# Patient Record
Sex: Female | Born: 1945 | Race: Black or African American | Hispanic: No | Smoking: Current every day smoker
Health system: Southern US, Community
[De-identification: ages and names within clinical notes are randomized; demographics above are authoritative.]

## PROBLEM LIST (undated history)

## (undated) DIAGNOSIS — F17200 Nicotine dependence, unspecified, uncomplicated: Secondary | ICD-10-CM

## (undated) DIAGNOSIS — M1990 Unspecified osteoarthritis, unspecified site: Secondary | ICD-10-CM

## (undated) DIAGNOSIS — E8881 Metabolic syndrome: Secondary | ICD-10-CM

## (undated) DIAGNOSIS — Z9989 Dependence on other enabling machines and devices: Secondary | ICD-10-CM

## (undated) DIAGNOSIS — E119 Type 2 diabetes mellitus without complications: Secondary | ICD-10-CM

## (undated) DIAGNOSIS — G4733 Obstructive sleep apnea (adult) (pediatric): Secondary | ICD-10-CM

## (undated) DIAGNOSIS — J449 Chronic obstructive pulmonary disease, unspecified: Secondary | ICD-10-CM

## (undated) DIAGNOSIS — C801 Malignant (primary) neoplasm, unspecified: Secondary | ICD-10-CM

## (undated) DIAGNOSIS — L723 Sebaceous cyst: Secondary | ICD-10-CM

## (undated) HISTORY — DX: Sebaceous cyst: L72.3

## (undated) HISTORY — DX: Unspecified osteoarthritis, unspecified site: M19.90

## (undated) HISTORY — DX: Chronic obstructive pulmonary disease, unspecified: J44.9

## (undated) HISTORY — PX: OTHER SURGICAL HISTORY: SHX169

## (undated) HISTORY — DX: Type 2 diabetes mellitus without complications: E11.9

## (undated) HISTORY — PX: THROAT SURGERY: SHX803

## (undated) HISTORY — DX: Malignant (primary) neoplasm, unspecified: C80.1

## (undated) HISTORY — DX: Dependence on other enabling machines and devices: Z99.89

## (undated) HISTORY — DX: Nicotine dependence, unspecified, uncomplicated: F17.200

## (undated) HISTORY — DX: Metabolic syndrome: E88.810

## (undated) HISTORY — DX: Obstructive sleep apnea (adult) (pediatric): G47.33

## (undated) HISTORY — DX: Metabolic syndrome: E88.81

---

## 1968-06-23 HISTORY — PX: CHOLECYSTECTOMY: SHX55

## 2004-10-11 ENCOUNTER — Encounter: Payer: Self-pay | Admitting: Family Medicine

## 2005-06-23 HISTORY — PX: OTHER SURGICAL HISTORY: SHX169

## 2006-08-25 ENCOUNTER — Encounter: Payer: Self-pay | Admitting: Family Medicine

## 2006-08-25 LAB — CONVERTED CEMR LAB
Alkaline Phosphatase: 79 units/L
Glucose, Bld: 177 mg/dL
HCT: 41 %
HDL: 48 mg/dL
Hemoglobin: 13.7 g/dL
Sodium: 141 meq/L
Total Bilirubin: 0.2 mg/dL
Total Protein: 7.1 g/dL
Triglycerides: 244 mg/dL
WBC, blood: 5.7 10*3/uL

## 2006-10-07 ENCOUNTER — Ambulatory Visit: Payer: Self-pay | Admitting: Family Medicine

## 2006-10-07 DIAGNOSIS — M25519 Pain in unspecified shoulder: Secondary | ICD-10-CM

## 2006-10-07 DIAGNOSIS — M1990 Unspecified osteoarthritis, unspecified site: Secondary | ICD-10-CM | POA: Insufficient documentation

## 2006-10-08 ENCOUNTER — Encounter: Payer: Self-pay | Admitting: Family Medicine

## 2006-10-09 ENCOUNTER — Telehealth: Payer: Self-pay | Admitting: Family Medicine

## 2006-10-09 ENCOUNTER — Encounter: Payer: Self-pay | Admitting: Family Medicine

## 2006-10-09 DIAGNOSIS — J449 Chronic obstructive pulmonary disease, unspecified: Secondary | ICD-10-CM

## 2006-10-09 DIAGNOSIS — F17200 Nicotine dependence, unspecified, uncomplicated: Secondary | ICD-10-CM | POA: Insufficient documentation

## 2006-10-09 DIAGNOSIS — J4489 Other specified chronic obstructive pulmonary disease: Secondary | ICD-10-CM | POA: Insufficient documentation

## 2006-10-09 DIAGNOSIS — M5030 Other cervical disc degeneration, unspecified cervical region: Secondary | ICD-10-CM

## 2006-10-23 ENCOUNTER — Encounter: Payer: Self-pay | Admitting: Family Medicine

## 2006-10-26 ENCOUNTER — Encounter: Payer: Self-pay | Admitting: Family Medicine

## 2006-11-24 ENCOUNTER — Encounter: Payer: Self-pay | Admitting: Family Medicine

## 2007-06-24 DIAGNOSIS — C801 Malignant (primary) neoplasm, unspecified: Secondary | ICD-10-CM

## 2007-06-24 HISTORY — PX: OTHER SURGICAL HISTORY: SHX169

## 2007-06-24 HISTORY — DX: Malignant (primary) neoplasm, unspecified: C80.1

## 2008-03-07 ENCOUNTER — Ambulatory Visit: Payer: Self-pay | Admitting: Family Medicine

## 2008-03-07 DIAGNOSIS — L723 Sebaceous cyst: Secondary | ICD-10-CM | POA: Insufficient documentation

## 2008-03-22 ENCOUNTER — Ambulatory Visit: Payer: Self-pay | Admitting: Family Medicine

## 2008-04-06 ENCOUNTER — Ambulatory Visit: Payer: Self-pay | Admitting: Family Medicine

## 2008-04-06 ENCOUNTER — Encounter: Payer: Self-pay | Admitting: Family Medicine

## 2008-04-06 ENCOUNTER — Other Ambulatory Visit: Admission: RE | Admit: 2008-04-06 | Discharge: 2008-04-06 | Payer: Self-pay | Admitting: Family Medicine

## 2008-04-06 DIAGNOSIS — I10 Essential (primary) hypertension: Secondary | ICD-10-CM | POA: Insufficient documentation

## 2008-04-06 DIAGNOSIS — M899 Disorder of bone, unspecified: Secondary | ICD-10-CM | POA: Insufficient documentation

## 2008-04-06 DIAGNOSIS — M949 Disorder of cartilage, unspecified: Secondary | ICD-10-CM

## 2008-04-06 LAB — HM PAP SMEAR

## 2008-04-19 ENCOUNTER — Encounter: Payer: Self-pay | Admitting: Family Medicine

## 2008-04-20 ENCOUNTER — Encounter: Payer: Self-pay | Admitting: Family Medicine

## 2008-04-24 ENCOUNTER — Encounter: Payer: Self-pay | Admitting: Family Medicine

## 2008-04-25 ENCOUNTER — Encounter: Payer: Self-pay | Admitting: Family Medicine

## 2008-04-25 LAB — CONVERTED CEMR LAB
ALT: 15 units/L (ref 0–35)
AST: 12 units/L (ref 0–37)
Albumin: 4.3 g/dL (ref 3.5–5.2)
Alkaline Phosphatase: 62 units/L (ref 39–117)
BUN: 21 mg/dL (ref 6–23)
CO2: 29 meq/L (ref 19–32)
Calcium: 9.3 mg/dL (ref 8.4–10.5)
Chloride: 104 meq/L (ref 96–112)
Cholesterol: 188 mg/dL (ref 0–200)
Creatinine, Ser: 0.76 mg/dL (ref 0.40–1.20)
Glucose, Bld: 117 mg/dL — ABNORMAL HIGH (ref 70–99)
HCT: 43 % (ref 36.0–46.0)
HDL: 42 mg/dL (ref >39)
Hemoglobin: 14.2 g/dL (ref 12.0–15.0)
LDL Cholesterol: 112 mg/dL — ABNORMAL HIGH (ref 0–99)
MCHC: 33 g/dL (ref 30.0–36.0)
MCV: 93.3 fL (ref 78.0–100.0)
Platelets: 298 10*3/uL (ref 150–400)
Potassium: 4.5 meq/L (ref 3.5–5.3)
RBC: 4.61 M/uL (ref 3.87–5.11)
RDW: 13.8 % (ref 11.5–15.5)
Sodium: 143 meq/L (ref 135–145)
TSH: 0.959 microintl units/mL (ref 0.350–4.50)
Total Bilirubin: 0.3 mg/dL (ref 0.3–1.2)
Total CHOL/HDL Ratio: 4.5
Total Protein: 6.9 g/dL (ref 6.0–8.3)
Triglycerides: 168 mg/dL — ABNORMAL HIGH (ref <150)
VLDL: 34 mg/dL (ref 0–40)
Vit D, 1,25-Dihydroxy: 23 — ABNORMAL LOW (ref 30–89)
WBC: 6.6 10*3/uL (ref 4.0–10.5)

## 2008-05-04 ENCOUNTER — Encounter: Payer: Self-pay | Admitting: Family Medicine

## 2008-05-05 ENCOUNTER — Ambulatory Visit: Payer: Self-pay | Admitting: Family Medicine

## 2008-05-05 DIAGNOSIS — R7301 Impaired fasting glucose: Secondary | ICD-10-CM

## 2008-05-05 LAB — CONVERTED CEMR LAB: Blood Glucose, Fasting: 117 mg/dL

## 2008-05-12 ENCOUNTER — Encounter: Payer: Self-pay | Admitting: Family Medicine

## 2008-05-17 ENCOUNTER — Encounter: Payer: Self-pay | Admitting: Family Medicine

## 2008-05-22 ENCOUNTER — Encounter: Payer: Self-pay | Admitting: Family Medicine

## 2008-05-22 ENCOUNTER — Telehealth: Payer: Self-pay | Admitting: Family Medicine

## 2008-05-22 DIAGNOSIS — C50919 Malignant neoplasm of unspecified site of unspecified female breast: Secondary | ICD-10-CM | POA: Insufficient documentation

## 2008-05-22 LAB — HM COLONOSCOPY

## 2008-06-01 ENCOUNTER — Encounter: Payer: Self-pay | Admitting: Family Medicine

## 2008-06-01 ENCOUNTER — Encounter: Admission: RE | Admit: 2008-06-01 | Discharge: 2008-06-01 | Payer: Self-pay | Admitting: General Surgery

## 2008-06-05 ENCOUNTER — Encounter: Admission: RE | Admit: 2008-06-05 | Discharge: 2008-06-05 | Payer: Self-pay | Admitting: General Surgery

## 2008-06-06 ENCOUNTER — Ambulatory Visit (HOSPITAL_BASED_OUTPATIENT_CLINIC_OR_DEPARTMENT_OTHER): Admission: RE | Admit: 2008-06-06 | Discharge: 2008-06-07 | Payer: Self-pay | Admitting: General Surgery

## 2008-06-06 ENCOUNTER — Encounter: Payer: Self-pay | Admitting: Family Medicine

## 2008-06-06 ENCOUNTER — Encounter (INDEPENDENT_AMBULATORY_CARE_PROVIDER_SITE_OTHER): Payer: Self-pay | Admitting: General Surgery

## 2008-06-20 ENCOUNTER — Ambulatory Visit (HOSPITAL_COMMUNITY): Admission: RE | Admit: 2008-06-20 | Discharge: 2008-06-20 | Payer: Self-pay | Admitting: General Surgery

## 2008-06-20 ENCOUNTER — Encounter (INDEPENDENT_AMBULATORY_CARE_PROVIDER_SITE_OTHER): Payer: Self-pay | Admitting: General Surgery

## 2008-07-17 ENCOUNTER — Encounter: Payer: Self-pay | Admitting: Family Medicine

## 2008-08-03 ENCOUNTER — Encounter: Payer: Self-pay | Admitting: Family Medicine

## 2008-08-06 ENCOUNTER — Encounter: Payer: Self-pay | Admitting: Family Medicine

## 2008-08-24 ENCOUNTER — Encounter: Payer: Self-pay | Admitting: Family Medicine

## 2008-10-12 ENCOUNTER — Encounter: Payer: Self-pay | Admitting: Family Medicine

## 2008-10-26 ENCOUNTER — Encounter: Payer: Self-pay | Admitting: Family Medicine

## 2009-01-12 ENCOUNTER — Encounter: Payer: Self-pay | Admitting: Family Medicine

## 2009-01-19 ENCOUNTER — Encounter: Payer: Self-pay | Admitting: Family Medicine

## 2009-01-23 ENCOUNTER — Encounter: Payer: Self-pay | Admitting: Family Medicine

## 2009-04-27 ENCOUNTER — Ambulatory Visit: Payer: Self-pay | Admitting: Family Medicine

## 2009-06-19 ENCOUNTER — Encounter: Payer: Self-pay | Admitting: Family Medicine

## 2009-06-20 DIAGNOSIS — E785 Hyperlipidemia, unspecified: Secondary | ICD-10-CM | POA: Insufficient documentation

## 2009-06-20 LAB — CONVERTED CEMR LAB
ALT: 17 units/L (ref 0–35)
Albumin: 4.3 g/dL (ref 3.5–5.2)
BUN: 18 mg/dL (ref 6–23)
CO2: 28 meq/L (ref 19–32)
Calcium: 9 mg/dL (ref 8.4–10.5)
Chloride: 102 meq/L (ref 96–112)
Cholesterol: 232 mg/dL — ABNORMAL HIGH (ref 0–200)
Creatinine, Ser: 0.7 mg/dL (ref 0.40–1.20)
HCT: 41.2 % (ref 36.0–46.0)
Hemoglobin: 14.4 g/dL (ref 12.0–15.0)
Platelets: 305 10*3/uL (ref 150–400)
Total CHOL/HDL Ratio: 5.8
WBC: 5.2 10*3/uL (ref 4.0–10.5)

## 2009-08-27 ENCOUNTER — Encounter: Payer: Self-pay | Admitting: Family Medicine

## 2009-11-07 ENCOUNTER — Encounter: Payer: Self-pay | Admitting: Family Medicine

## 2010-01-25 ENCOUNTER — Encounter: Admission: RE | Admit: 2010-01-25 | Discharge: 2010-01-25 | Payer: Self-pay | Admitting: General Surgery

## 2010-01-25 LAB — HM MAMMOGRAPHY

## 2010-02-11 ENCOUNTER — Encounter: Payer: Self-pay | Admitting: Family Medicine

## 2010-02-19 ENCOUNTER — Encounter: Payer: Self-pay | Admitting: Family Medicine

## 2010-03-20 IMAGING — CR DG CHEST 2V
2 series · 2 of 2 positions shown · non-contrast
Comparison: No prior studies.

CLINICAL DATA: Right breast cancer.  Preoperative chest.

CHEST - 2 VIEW

[w chest pa]
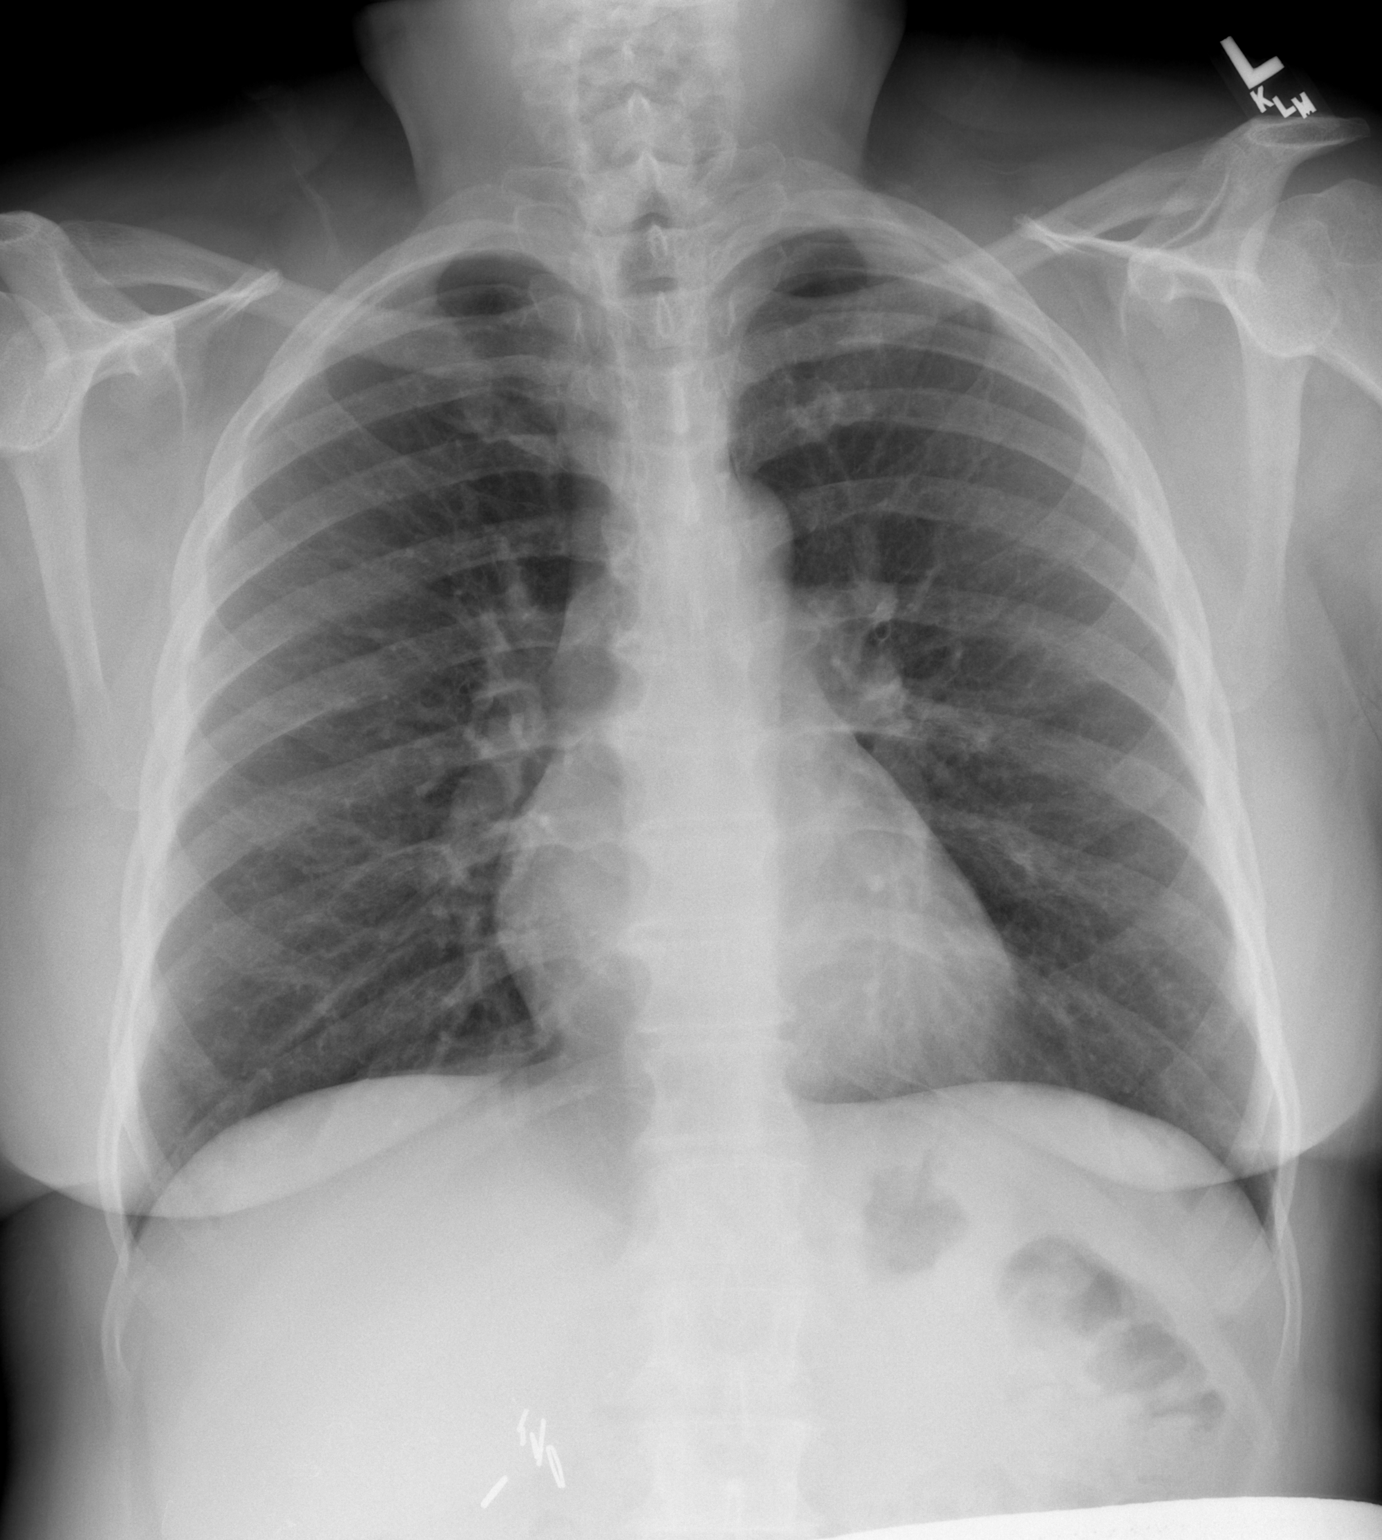

[w chest lat]
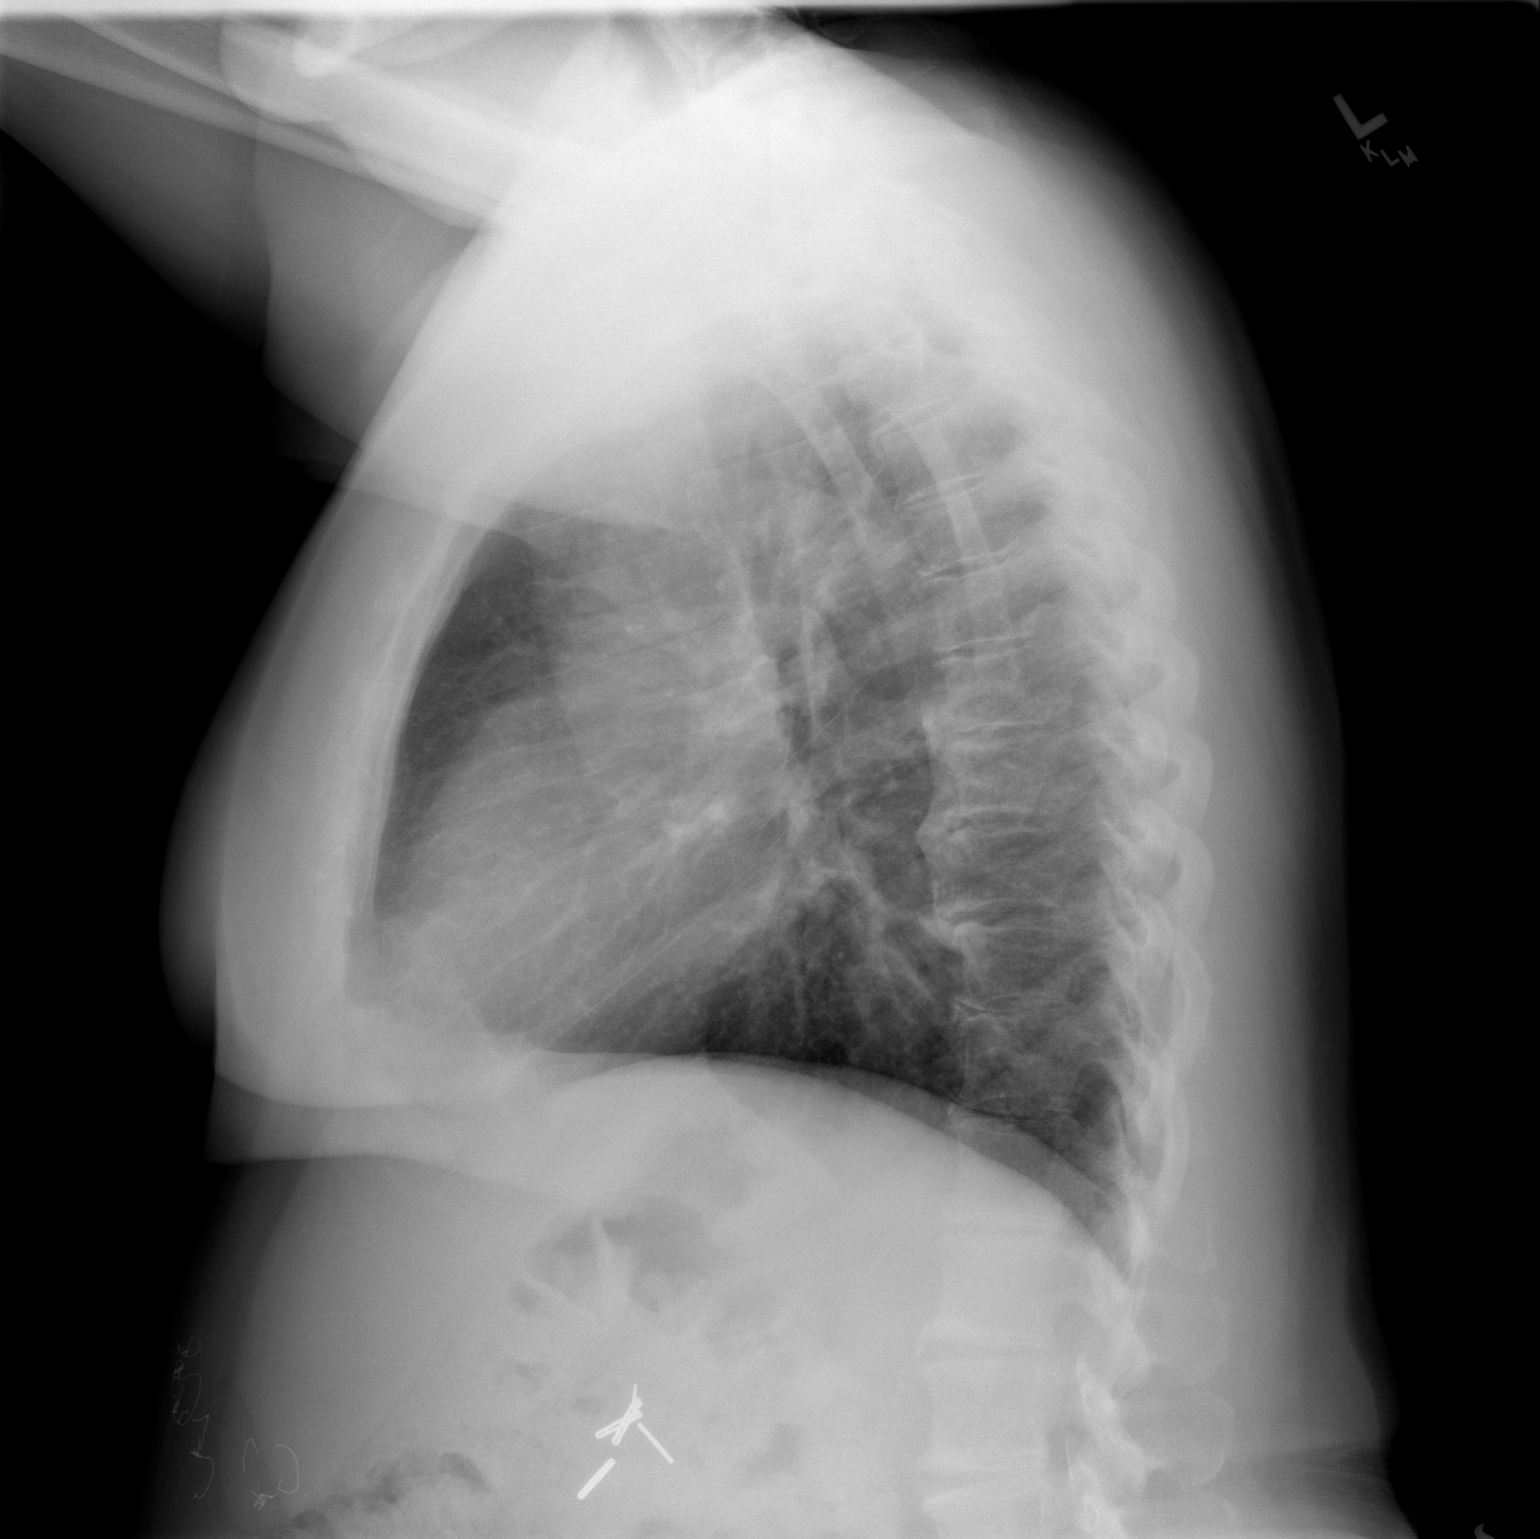

[2 of 2 positions shown; findings below may reference images not displayed]

FINDINGS: The lungs are clear.  The heart and mediastinal
structures are normal.
IMPRESSION: No evidence for active chest disease.

## 2010-04-15 ENCOUNTER — Encounter: Payer: Self-pay | Admitting: Family Medicine

## 2010-05-20 ENCOUNTER — Telehealth (INDEPENDENT_AMBULATORY_CARE_PROVIDER_SITE_OTHER): Payer: Self-pay | Admitting: *Deleted

## 2010-05-20 ENCOUNTER — Ambulatory Visit: Payer: Self-pay | Admitting: Family Medicine

## 2010-05-20 ENCOUNTER — Encounter: Admission: RE | Admit: 2010-05-20 | Discharge: 2010-05-20 | Payer: Self-pay | Admitting: Family Medicine

## 2010-05-20 DIAGNOSIS — R1011 Right upper quadrant pain: Secondary | ICD-10-CM

## 2010-05-21 LAB — CONVERTED CEMR LAB
Alkaline Phosphatase: 78 units/L (ref 39–117)
BUN: 19 mg/dL (ref 6–23)
CO2: 32 meq/L (ref 19–32)
Creatinine, Ser: 0.74 mg/dL (ref 0.40–1.20)
Eosinophils Absolute: 0.1 10*3/uL (ref 0.0–0.7)
Eosinophils Relative: 1 % (ref 0–5)
Glucose, Bld: 119 mg/dL — ABNORMAL HIGH (ref 70–99)
HCT: 44 % (ref 36.0–46.0)
Hemoglobin: 14.6 g/dL (ref 12.0–15.0)
Lymphs Abs: 2.3 10*3/uL (ref 0.7–4.0)
MCV: 94.4 fL (ref 78.0–100.0)
Monocytes Absolute: 0.4 10*3/uL (ref 0.1–1.0)
Monocytes Relative: 7 % (ref 3–12)
Platelets: 292 10*3/uL (ref 150–400)
Sodium: 140 meq/L (ref 135–145)
Total Bilirubin: 0.3 mg/dL (ref 0.3–1.2)
WBC: 4.9 10*3/uL (ref 4.0–10.5)

## 2010-05-23 HISTORY — PX: OTHER SURGICAL HISTORY: SHX169

## 2010-06-04 ENCOUNTER — Encounter: Payer: Self-pay | Admitting: Family Medicine

## 2010-07-23 NOTE — Letter (Signed)
Summary: [facility]   Imported By: Lanelle Bal 11/27/2009 11:51:02  _____________________________________________________________________  External Attachment:    Type:   Image     Comment:   External Document

## 2010-07-23 NOTE — Letter (Signed)
Summary: [facility]  [facility]   Imported By: Lanelle Bal 09/03/2009 13:15:32  _____________________________________________________________________  External Attachment:    Type:   Image     Comment:   External Document

## 2010-07-23 NOTE — Progress Notes (Signed)
----   Converted from flag ---- ---- 05/20/2010 9:11 AM, Seymour Bars DO wrote: pt needs f/u with GI - Dr Nadara Mode- had abnormal colonoscopy in 09 and was due for repeat in 2010 but had breast cancer.  now, she is having LUQ pain x 3 mos ------------------------------  05/20/2010- Scheduled pt to see Dr. Jacqulyn Bath at [facility] on 06/04/2010 @ 4:00pm. Dr. Nadara Mode has left the practice and moved to [facility]. This appt is at the [place] location. KJ LPN

## 2010-07-23 NOTE — Assessment & Plan Note (Signed)
Summary: pre - op PHYSICAL   Vital Signs:  Patient profile:   65 year old female Height:      63.75 inches Weight:      179 pounds BMI:     31.08 O2 Sat:      96 % on Room air Pulse rate:   73 / minute BP sitting:   113 / 69  (left arm) Cuff size:   regular  Vitals Entered By: Payton Spark CMA (May 20, 2010 8:49 AM)  O2 Flow:  Room air CC: CPE w/ out pap   History of Present Illness: Lori Hess presents for CPE w/o pap and a pre- op physical for upcoming cataract surgery.  She has a hx of R breast cancer from 09 s/p lumpectomy, radiation and now on Arimidex.  She sees Dr Carolynne Edouard and is UTD with mammogram and breast MRI but failed to f/u with Dr Darrick Huntsman in October and has not had a PET scan done.  She had multiple polyps on colonoscopy in 09 and was due for f/u in 2010 but did not go.  Her fsting labs were done 05-2009.  She has had RUQ pain x 3 mos but no blood in stools, constipation, anorexia, diarrhea or nausea.    Denies CP or DOE but she is a smoker with COPD, not on meds.  Has not had a CXR done in yrs.  Her flu shot is due.  Tdap is UTD.  Pneumovax done in 09.    Current Medications (verified): 1)  Hydrochlorothiazide 25 Mg Tabs (Hydrochlorothiazide) .Marland Kitchen.. 1 Tab By Mouth Qam 2)  Vitamin D 50,000 Iu Capsules .Marland KitchenMarland Kitchen. 1 Cap By Mouth Q Week 3)  Omeprazole 40 Mg Cpdr (Omeprazole) .... Take 1 Tab By Mouth Once Daily 4)  Anastrozole 1 Mg Tabs (Anastrozole) .... Take 1 Tab By Mouth Once Daily  Allergies (verified): No Known Drug Allergies  Past History:  Past Medical History: Osteoarthritis OSA- on CPAP COPD G2P2 tobacco dependence, tried Chantix EKG done 3-08, 04-2010 metabolic syndrome sebaceous cyst parietal area Vit D <7.0 = VERY LOW! 3-08 R sided breast cancer (DR TOTH/ MCCUNNIFF/ SHEARER) 2009  Past Surgical History: Reviewed history from 04/27/2009 and no changes required. eyelid lifting 2007 gall bladder 1970 R breast lumpectomy 09 Dr Carolynne Edouard  Family  History: Reviewed history from 10/07/2006 and no changes required. father DM, deceased mother died from colon cancer in her 5s, also had endometrial cancer; HTN brother healthy  Social History: Reviewed history from 10/07/2006 and no changes required. 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  No relationship.  2 grown children are local.  Lives alone.  Smokes 1 ppd x 35 yrs.  Denies ETOH.  Does not exercise, but active in the yard.  Fair diet.    Review of Systems       The patient complains of abdominal pain.  The patient denies anorexia, fever, weight loss, weight gain, vision loss, decreased hearing, hoarseness, chest pain, syncope, dyspnea on exertion, peripheral edema, prolonged cough, headaches, hemoptysis, melena, hematochezia, severe indigestion/heartburn, hematuria, incontinence, genital sores, muscle weakness, suspicious skin lesions, transient blindness, difficulty walking, depression, unusual weight change, abnormal bleeding, enlarged lymph nodes, angioedema, breast masses, and testicular masses.    Physical Exam  General:  alert, well-developed, well-nourished, well-hydrated, and overweight-appearing.   Head:  normocephalic and atraumatic.   Eyes:  pupils equal, pupils round, and pupils reactive to light.  bilat ant chamber clouding Ears:  EACs patent; TMs translucent and  gray with good cone of light and bony landmarks.  Nose:  no nasal discharge.   Mouth:  pharynx pink and moist.   Neck:  no masses.   Lungs:  Normal respiratory effort, chest expands symmetrically. Lungs are clear to auscultation, no crackles or wheezes. Heart:  irreg rhythm, no M Abdomen:  RUQ cholecystectomy scar, tender w/o hernia.  ND.  NABS.  No HSM. Pulses:  2+ radial and pedal pulses Extremities:  no UE or LE edema Neurologic:  gait normal.   Skin:  color normal.   Cervical Nodes:  No lymphadenopathy noted Psych:  good eye contact, not anxious appearing, and not depressed  appearing.     Impression & Recommendations:  Problem # 1:  HEALTH MAINTENANCE EXAM (ICD-V70.0) Keeping healthy checklist for women reviewed. BP at goal.  BMI 31 c/w class I obesity. Overdue for repeat colonoscopy with Dr Nadara Mode-- scheduled f/u with him. R breast CA - mammo is UTD and has seen dr Carolynne Edouard, on Armidiex.  Will set her up for f/u with Dr Darrick Huntsman. EKG done showing NSR with sinus arrythmia, no ischemia.  Plan to get stress test in 2012. Enocuarged smoking cessation and compliance iwth CPAP.  CXR today. Flu shot today.  CBC and CMP for RUQ pain; fasting lipids at f/u visit. Work on [employer], regular exercise, MVI and calcium with D daily.  Complete Medication List: 1)  Hydrochlorothiazide 25 Mg Tabs (Hydrochlorothiazide) .Marland Kitchen.. 1 tab by mouth qam 2)  Vitamin D 50,000 Iu Capsules  .Marland Kitchen.. 1 cap by mouth q week 3)  Omeprazole 40 Mg Cpdr (Omeprazole) .... Take 1 tab by mouth once daily 4)  Anastrozole 1 Mg Tabs (Anastrozole) .... Take 1 tab by mouth once daily  Other Orders: T-DG Chest 2 View [phone]) T-CBC w/Diff [phone]) T-Comprehensive Metabolic Panel [phone]) Admin 1st Vaccine (42706) Flu Vaccine 52yrs + (23762) EKG w/ Interpretation (93000)  Patient Instructions: 1)  Labs/ CXR to be done downstairs today. 2)  Will call you w/ results tomorrow.  3)  Form completed for cataract surgery. 4)  Flu shot today. 5)  Pap smear due in 1 yr. 6)  Will get you back in with GI doctor. 7)  Plan to update stress test in 2012. 8)  Schedule follow up COPD/ HTN/ fasting labs in 3 mos.   Orders Added: 1)  T-DG Chest 2 View [71020] 2)  T-CBC w/Diff [[id number]] 3)  T-Comprehensive Metabolic Panel [[id number]] 4)  Admin 1st Vaccine [90471] 5)  Flu Vaccine 67yrs + [90658] 6)  Est. Patient age 80&> [73710] 7)  EKG w/ Interpretation [93000]  Flu Vaccine Consent Questions     Do you have a history of severe allergic reactions to this vaccine? no    Any prior history of  allergic reactions to egg and/or gelatin? no    Do you have a sensitivity to the preservative Thimersol? no    Do you have a past history of Guillan-Barre Syndrome? no    Do you currently have an acute febrile illness? no    Have you ever had a severe reaction to latex? no    Vaccine information given and explained to patient? yes    Are you currently pregnant? no    Lot Number:[id number]   Exp Date:12/21/2010   Site Given  Left Deltoid IManagement-CCC]     .lbflu

## 2010-07-23 NOTE — Letter (Signed)
Summary: [facility]   Imported By: Lanelle Bal 05/04/2010 11:43:04  _____________________________________________________________________  External Attachment:    Type:   Image     Comment:   External Document

## 2010-07-23 NOTE — Letter (Signed)
Summary: [facility]   Imported By: Lanelle Bal 03/01/2010 12:16:08  _____________________________________________________________________  External Attachment:    Type:   Image     Comment:   External Document

## 2010-07-23 NOTE — Letter (Signed)
Summary: [facility]  [facility]   Imported By: Lanelle Bal 03/04/2010 09:30:08  _____________________________________________________________________  External Attachment:    Type:   Image     Comment:   External Document

## 2010-07-25 NOTE — Consult Note (Signed)
Summary: [facility]   Imported By: Lanelle Bal 06/13/2010 14:06:24  _____________________________________________________________________  External Attachment:    Type:   Image     Comment:   External Document

## 2010-08-20 ENCOUNTER — Ambulatory Visit: Payer: Self-pay | Admitting: Family Medicine

## 2010-08-28 ENCOUNTER — Other Ambulatory Visit (HOSPITAL_COMMUNITY)
Admission: RE | Admit: 2010-08-28 | Discharge: 2010-08-28 | Disposition: A | Discharge status: Home / Self Care | Payer: BC Managed Care – PPO | Source: Ambulatory Visit | Attending: Family Medicine | Admitting: Family Medicine

## 2010-08-28 ENCOUNTER — Encounter (INDEPENDENT_AMBULATORY_CARE_PROVIDER_SITE_OTHER): Payer: BC Managed Care – PPO | Admitting: Family Medicine

## 2010-08-28 ENCOUNTER — Other Ambulatory Visit: Payer: Self-pay | Admitting: Family Medicine

## 2010-08-28 ENCOUNTER — Encounter: Payer: Self-pay | Admitting: Family Medicine

## 2010-08-28 DIAGNOSIS — Z01419 Encounter for gynecological examination (general) (routine) without abnormal findings: Secondary | ICD-10-CM

## 2010-08-29 ENCOUNTER — Encounter: Payer: Self-pay | Admitting: Family Medicine

## 2010-08-30 ENCOUNTER — Telehealth: Payer: Self-pay | Admitting: Family Medicine

## 2010-08-30 ENCOUNTER — Encounter: Payer: Self-pay | Admitting: Family Medicine

## 2010-08-30 LAB — CONVERTED CEMR LAB
AST: 12 units/L (ref 0–37)
Albumin: 4.2 g/dL (ref 3.5–5.2)
Alkaline Phosphatase: 72 units/L (ref 39–117)
HDL: 44 mg/dL (ref >39)
LDL Cholesterol: 142 mg/dL — ABNORMAL HIGH (ref 0–99)
Potassium: 4.2 meq/L (ref 3.5–5.3)
Sodium: 138 meq/L (ref 135–145)
Total Bilirubin: 0.3 mg/dL (ref 0.3–1.2)
Total Protein: 6.5 g/dL (ref 6.0–8.3)
Triglycerides: 212 mg/dL — ABNORMAL HIGH (ref <150)
VLDL: 42 mg/dL — ABNORMAL HIGH (ref 0–40)
Vit D, 25-Hydroxy: 21 ng/mL — ABNORMAL LOW (ref 30–89)

## 2010-09-03 ENCOUNTER — Encounter: Payer: Self-pay | Admitting: Family Medicine

## 2010-09-03 NOTE — Progress Notes (Signed)
Summary: Vesicare  Phone Note Call from Patient Call back at Work Phone [phone]   Caller: Patient Call For: Seymour Bars DO Reason for Call: Refill Medication Summary of Call: Pt recevied samples of Vesicare at visit on 08/28/10 and states that it WORKS!  Pt would like rx sent to [facility].  Rx to be sent per Dr. Cathey Endow. Initial call taken by: Francee Piccolo CMA Duncan Dull),  August 30, 2010 1:40 PM    New/Updated Medications: VESICARE 5 MG TABS (SOLIFENACIN SUCCINATE) Take 1 tablet by mouth once a day Prescriptions: VESICARE 5 MG TABS (SOLIFENACIN SUCCINATE) Take 1 tablet by mouth once a day  #30 x 3   Entered and Authorized by:   Seymour Bars DO   Signed by:   Seymour Bars DO on 08/30/2010   Method used:   Electronically to        [facility]  [phone]* (retail)       [address]       [place], [place]  [address]       Ph: [phone]       Fax: [phone]   RxID:   [id number]

## 2010-09-03 NOTE — Assessment & Plan Note (Signed)
Summary: CPE with pap   Vital Signs:  Patient profile:   65 year old female Height:      63.75 inches Weight:      188 pounds BMI:     32.64 O2 Sat:      96 % on Room air Pulse rate:   76 / minute BP sitting:   117 / 68  (left arm) Cuff size:   large  Vitals Entered By: Payton Spark CMA (August 28, 2010 9:25 AM)  O2 Flow:  Room air CC: CPE    Primary Care Provider:  Seymour Bars DO  CC:  CPE .  History of Present Illness: 65 yo AAF presents for CPE.  She is doing well on HCTZ for HTN.  She has gained wt but admits to eating more.  she is UTD with her mammogram but is due for her DEXA (it has been 3 yrs).  She is on anti estrogen meds for her hx of breast cancer and follows only with Dr Carolynne Edouard once a year.  She had her pap done last 3 yrs ago.  her PNX and Tetanus vaccines are UTD.  She continues to smoke and is not interested in quitting.  She had a normal CXR and EKG prior to her cataract (L) surgery done in November and denies CP or DOE.  She is doing well on her current meds.  She had a colonoscopy done in 09 and thinks she is due for f/u.  Current Medications (verified): 1)  Hydrochlorothiazide 25 Mg Tabs (Hydrochlorothiazide) .Marland Kitchen.. 1 Tab By Mouth Qam 2)  Omeprazole 40 Mg Cpdr (Omeprazole) .... Take 1 Tab By Mouth Once Daily 3)  Anastrozole 1 Mg Tabs (Anastrozole) .... Take 1 Tab By Mouth Once Daily  Allergies (verified): No Known Drug Allergies  Past History:  Past Medical History: Reviewed history from 05/20/2010 and no changes required. Osteoarthritis OSA- on CPAP COPD G2P2 tobacco dependence, tried Chantix EKG done 3-08, 04-2010 metabolic syndrome sebaceous cyst parietal area Vit D <7.0 = VERY LOW! 3-08 R sided breast cancer (DR TOTH/ MCCUNNIFF/ SHEARER) 2009  Past Surgical History: eyelid lifting 2007 gall bladder 1970 R breast lumpectomy 09 Dr Carolynne Edouard L cataract surgery 05-2010  Family History: Reviewed history from 10/07/2006 and no changes  required. father DM, deceased mother died from colon cancer in her 52s, also had endometrial cancer; HTN brother healthy  Social History: Reviewed history from 10/07/2006 and no changes required. 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  No relationship.  2 grown children are local.  Lives alone.  Smokes 1 ppd x 35 yrs.  Denies ETOH.  Does not exercise, but active in the yard.  Fair diet.    Review of Systems       The patient complains of weight gain.  The patient denies anorexia, fever, weight loss, vision loss, decreased hearing, hoarseness, chest pain, syncope, dyspnea on exertion, peripheral edema, prolonged cough, headaches, hemoptysis, abdominal pain, melena, hematochezia, severe indigestion/heartburn, hematuria, incontinence, genital sores, muscle weakness, suspicious skin lesions, transient blindness, difficulty walking, depression, unusual weight change, abnormal bleeding, enlarged lymph nodes, angioedema, breast masses, and testicular masses.    Physical Exam  General:  alert, well-developed, well-nourished, well-hydrated, and overweight-appearing.   Head:  normocephalic and atraumatic.   Eyes:  anteriro chamber clouding on the R/ lens implant on the L, PERRLA Ears:  EACs patent; TMs translucent and gray with good cone of light and bony landmarks.  Nose:  no nasal discharge.  boggy turbinates Mouth:  pharynx pink and moist.   Neck:  no masses.  no audible carotid bruits Breasts:  radiation changes (hardness) to the R breast, no masses, no nipple discharge, no tenderness, and no adenopathy.   Lungs:  Normal respiratory effort, chest expands symmetrically. Lungs are clear to auscultation, no crackles or wheezes.  prolonged exp phase Heart:  Normal rate and regular rhythm. S1 and S2 normal without gallop, murmur, click, rub or other extra sounds. Abdomen:  soft, non-tender, normal bowel sounds, no distention, no masses, no guarding, no hepatomegaly, and no  splenomegaly.  no AA bruits Genitalia:  Pelvic Exam:        External: normal female genitalia without lesions or masses, atrophic        Vagina: normal without lesions or masses        Cervix: normal without lesions or masses, due to atrophy and pt discomfort, os not able to be visualized.        Adnexa: normal bimanual exam without masses or fullness        Uterus: normal by palpation        Pap smear: performed Pulses:  2+ radial and pedal pulses Extremities:  no LE edema Skin:  color normal.   Cervical Nodes:  No lymphadenopathy noted Psych:  good eye contact, not anxious appearing, and not depressed appearing.     Impression & Recommendations:  Problem # 1:  Gynecological examination-routine (ICD-V72.31) Keeping healthy checklist for women reviewed. BP at goal.  BMI 32 c/w class I obesity. Counsled on healthy diet, regular exercise. Taking MVI daily and calcium/ D daily. Update fasting labs. Had normal EKG and CXR 04-2010 and is due for repeat colonoscopy with Dr Jonathon Bellows. Mammogram UTD but due for DEXA, ordered. Plan to do PFTs this year +/- stress test and will need PVD/ low back pain w/u.  Complete Medication List: 1)  Hydrochlorothiazide 25 Mg Tabs (Hydrochlorothiazide) .Marland Kitchen.. 1 tab by mouth qam 2)  Omeprazole 40 Mg Cpdr (Omeprazole) .... Take 1 tab by mouth once daily 3)  Anastrozole 1 Mg Tabs (Anastrozole) .... Take 1 tab by mouth once daily  Other Orders: T-DXA Bone Density/ Appendicular (60454) T-Dual DXA Bone Density/ Axial (09811) T-CBC w/Diff ([id number]) T-Comprehensive Metabolic Panel [phone] T-Lipid Profile ([id number]) T-Vitamin D (25-Hydroxy) (24401-02725)  Patient Instructions: 1)  Will call you with pap smear results next wk and lab results today. 2)  Update DEXA downstairs at your convenience. 3)  I will contact Dr Nadara Mode re: your next colonoscopy. 4)  Return for f/u leg pain and overactive bladder in 6 wks. 5)  Try Tylenol arthritis as needed  for arthritis pain.   Orders Added: 1)  T-DXA Bone Density/ Appendicular [77081] 2)  T-Dual DXA Bone Density/ Axial [77080] 3)  T-CBC w/Diff [[id number]] 4)  T-Comprehensive Metabolic Panel [[id number]] 5)  T-Lipid Profile [[id number]] 6)  T-Vitamin D (25-Hydroxy) [25956-38756] 7)  Est. Patient age 34&> [43329]

## 2010-09-04 ENCOUNTER — Encounter: Payer: Self-pay | Admitting: Family Medicine

## 2010-09-11 ENCOUNTER — Ambulatory Visit (INDEPENDENT_AMBULATORY_CARE_PROVIDER_SITE_OTHER): Payer: BC Managed Care – PPO | Admitting: Family Medicine

## 2010-09-11 ENCOUNTER — Encounter: Payer: Self-pay | Admitting: Family Medicine

## 2010-09-11 VITALS — BP 119/63 | HR 94 | Ht 63.0 in | Wt 188.0 lb

## 2010-09-11 DIAGNOSIS — E119 Type 2 diabetes mellitus without complications: Secondary | ICD-10-CM

## 2010-09-11 DIAGNOSIS — E785 Hyperlipidemia, unspecified: Secondary | ICD-10-CM

## 2010-09-11 DIAGNOSIS — R7301 Impaired fasting glucose: Secondary | ICD-10-CM

## 2010-09-11 DIAGNOSIS — I10 Essential (primary) hypertension: Secondary | ICD-10-CM

## 2010-09-11 LAB — POCT GLYCOSYLATED HEMOGLOBIN (HGB A1C): Hemoglobin A1C: 8.3

## 2010-09-11 MED ORDER — METFORMIN HCL 500 MG PO TABS: 500.0000 mg | ORAL_TABLET | Freq: Two times a day (BID) | ORAL | Status: DC

## 2010-09-11 NOTE — Patient Instructions (Signed)
A1C 8.3 consistent with a diagnosis of type II diabetes. Will get you set up to see a nutritionist.  Start METFORMIN 500 mg with dinner only (until your next office visit with me) to help bring sugar down.  Stay on Simvastatin at bedtime for high cholesterol and RX Vitamin D once a week.  Work on [employer] and regular exercise.  Return to recheck fasting sugar here in 6 wks.

## 2010-09-11 NOTE — Assessment & Plan Note (Signed)
BP at goal on HCTZ alone.  Plan to add ACE or ARB if urine microalbumin is +.

## 2010-09-11 NOTE — Assessment & Plan Note (Signed)
A1c was highi at 8.3 and her fasting sugar was 136, both c/w T2DM code.  Will start her on a diabetic diet and refer her to a nutritionist.  Will start her on metformin 500 mg only at night with dinner until her f/u in 6 wks.  I did not start her on home sugar checks yet.  Given info on diabetic diet today.  She needs to make some drastic changes to diet and exercise.  Plan to get a urine micro and dilated eye exam at her next visit + a monofilament.

## 2010-09-11 NOTE — Assessment & Plan Note (Signed)
Counseled on the importance of taking her simvastatin daily since she just started this.  Check LDL with LFTs in 2 mos.

## 2010-09-11 NOTE — Progress Notes (Signed)
  Subjective:    Patient ID: Lori Hess, female    DOB: 1945-09-11, 65 y.o.   MRN: [id number]  HPI 65 yo AAF presents for f/u elevated sugar of 136 on her CMP 2 wks ago.  She was never previously had a diagnosis of diabetes but both her mom and dad were diabetic.  She gets no exercise and admits to eating a lot of sweets at night.  She did fill her RX for Simvastatin for high cholesterol and is doing well on it for the past 4 days but she is leary to stay on cholesterol meds.  She denies CP or DOE.  She continues to smoke.  She also started RX Vitamin D once a week.    Review of Systems  Constitutional: Negative for fatigue and unexpected weight change.  Eyes: Negative for visual disturbance.  Respiratory: Negative for shortness of breath.   Cardiovascular: Negative for chest pain and palpitations.  Genitourinary: Negative for decreased urine volume.  Neurological: Negative for light-headedness and numbness.  Psychiatric/Behavioral: Negative for dysphoric mood.       Objective:   Physical Exam  Constitutional: She appears well-developed and well-nourished. No distress.       obese          Assessment & Plan:

## 2010-09-22 ENCOUNTER — Other Ambulatory Visit: Payer: Self-pay | Admitting: Family Medicine

## 2010-10-11 ENCOUNTER — Ambulatory Visit: Payer: BC Managed Care – PPO | Admitting: Family Medicine

## 2010-10-26 ENCOUNTER — Other Ambulatory Visit: Payer: Self-pay | Admitting: Family Medicine

## 2010-11-05 NOTE — Op Note (Signed)
Lori Hess, GROBE NO.:  [id number]   MEDICAL RECORD NO.:  [id number]          PATIENT TYPE:  AMB   LOCATION:  DSC                          FACILITY:  [facility]   PHYSICIAN:  Ollen Gross. Vernell Morgans, M.D. DATE OF BIRTH:  1946/03/11   DATE OF PROCEDURE:  06/06/2008  DATE OF DISCHARGE:                               OPERATIVE REPORT   PREOPERATIVE DIAGNOSIS:  Right breast cancer.   POSTOPERATIVE DIAGNOSIS:  Right breast cancer.   PROCEDURE:  Right breast lumpectomy and sentinel node biopsy with  injection of blue dye and subsequent right axillary lymph node  dissection.   SURGEON:  Ollen Gross. Vernell Morgans, MD   ANESTHESIA:  General endotracheal.   PROCEDURE:  After informed consent was obtained, the patient was brought  to the operating room and placed in supine position on the operating  room table.  After adequate induction of general anesthesia with an LMA,  the patient's right breast, chest and axilla were prepped with Betadine  and draped in usual sterile manner.  Earlier in the day, the patient had  undergone injection of 1 mCi of technetium sulfur colloid in the  subareolar position.  At this point, we also injected 2 mL of methylene  blue and 3 mL of injectable saline also into the subareolar position.  The breast was massaged for several minutes.  Using a NeoProbe, we were  able to identify a hot spot in the right axilla.  A small transverse  incision was made overlying this hot spot with a 15 blade knife.  This  incision was carried down through the skin and subcutaneous tissue  sharply with electrocautery until the axilla was entered.  A Weitlaner  retractor was deployed using the NeoProbe to guide the dissection and  using a blunt hemostat dissection, we were able to identify a hot blue  lymph node.  This was excised by combination of sharp Bovie dissection  and then the lymphatics were clamped with hemostats, divided and ligated  with 3-0 Vicryl ties.  This was  sent as sentinel node #1.  Second area  of radioactivity was also identified.  It was not blue and it was  excised sharply with the electrocautery and sent as sentinel node #2.  Once this was accomplished, the axilla appeared to be hemostatic.  The  deep layer was closed with interrupted 3-0 Vicryl stitches and the skin  was closed with a running 4-0 Monocryl subcuticular stitch.  At this  point, the attention was turned to the right breast in the upper inner  quadrant.  There was a palpable mass.  A curvilinear transverse incision  was made overlying the mass with a 15 blade knife.  This incision was  carried down through the skin and subcutaneous tissue sharply with the  electrocautery.  Once into the breast tissue, the mass could be  palpated.  A circular portion of breast tissue was excised sharply  around the mass.  This was all done with electrocautery and the  dissection was carried all the way down to the chest wall.  Once the  mass  was removed, it was oriented with the pain kit according to the  assigned colors.  The specimen radiograph was then obtained that showed  the clip to be in the center of the specimen and this was then sent to  pathology for further evaluation.  Hemostasis was achieved using the  Bovie electrocautery.  The wound was infiltrated with 0.25% Marcaine and  then irrigated with copious amounts of saline.  The deep layer of the  wound was then closed with interrupted 3-0 Vicryl stitches and the skin  was closed with a running 4-0 Monocryl subcuticular stitch.  At this  point, the pathologist called [place] back and said that sentinel node #1 was  positive for metastatic cancer of the breast.  At this point, we then  reopened the axillary incision and extended it anteriorly and  posteriorly with a 15 blade knife and the electrocautery.  At this  point, the dissection was carried into the subcutaneous tissue towards  the latissimus muscle and chest wall.  Once the  latissimus muscle and  chest wall were identified, then we were able to dissect superiorly  until we were able to identify the axillary vein.  Once these landmarks  were identified, the lymph node contents within the boundaries of these  landmarks was then dissected free by blunt right angle dissection.  The  long thoracic and thoracodorsal nerves were identified and care was  taken to preserve these nerves.  Once the contents of the axilla were  free, these were sent to pathology as right axillary contents.  The  wound was then examined and found to be hemostatic.  The wound was  irrigated with copious amounts of saline.  A small stab incision was  made inferior to the incision with a 15 blade knife.  A tonsil clamp was  then placed through that incision into the operative bed and the axilla.  A 19-French round Harrison Mons drain was then brought into the operative bed  and out through the skin opening.  The drain was laid along the chest  wall in the axilla.  The drain was anchored to the skin with a 3-0 nylon  stitch.  The deep layer of the wound was then closed with interrupted 3-  0 Vicryl stitches and the skin was closed with a running 4-0 Monocryl  subcuticular stitch.  Dermabond dressings were applied to each of the  incisions and sterile dressings to the drain.  The patient tolerated the  procedure well.  The drain was placed to bulb suction.  At the end of  the case, all needle, sponge and instrument counts were correct.  The  patient was then awakened and taken to recovery in stable condition.  During the case, the patient did not tolerate the LMA, probably  secondary to her recent throat surgery, so the anesthesiologist  converted to general endotracheal tube and she tolerated this well.      Ollen Gross. Vernell Morgans, M.D.  Electronically Signed     PST/MEDQ  D:  06/06/2008  T:  06/06/2008  Job:  [id number]

## 2010-11-05 NOTE — Op Note (Signed)
NAMECHARLA, Lori Hess              ACCOUNT NO.:  [id number]   MEDICAL RECORD NO.:  [id number]          PATIENT TYPE:  AMB   LOCATION:  SDS                          FACILITY:  [facility]   PHYSICIAN:  Ollen Gross. Vernell Morgans, M.D. DATE OF BIRTH:  22-Jun-1946   DATE OF PROCEDURE:  DATE OF DISCHARGE:  06/20/2008                               OPERATIVE REPORT   PREOPERATIVE DIAGNOSIS:  Right breast cancer with a positive lateral  margin.   POSTOPERATIVE DIAGNOSIS:  Right breast cancer with a positive lateral  margin.   PROCEDURES:  Re-excision of lateral margin in the right breast.   SURGEON:  Ollen Gross. Vernell Morgans, MD   ANESTHESIA:  General endotracheal.   PROCEDURE:  After informed consent was obtained, the patient was brought  to the operating room, placed in supine position on the operating room  table.  After adequate induction of general anesthesia, the patient's  right breast was prepped with Betadine and draped in usual sterile  manner.  Her old incision was opened sharply with a 15 blade knife.  The  seroma cavity was evacuated and the cavity itself looked very clean.  The lateral margin was then re-excised sharply with the electrocautery.  The tissue appeared to be normal, fatty appearing tissue.  This was  marked with a stitch on the true surgical margin and sent to pathology  for further evaluation.  Hemostasis was achieved using the Bovie  electrocautery.  The wound was irrigated copious amounts of saline.  The  deep layer of the wound was then closed with interrupted 3-0 Vicryl  stitches and the skin was closed with the running 4-0 Monocryl  subcuticular stitch.  Dermabond dressing was applied.  Prior to starting  the case, the patient's right axillary drain was putting out minimal  fluid and was removed without difficulty.  The patient tolerated the  procedure well.  At the end of case, all needle, sponge, and instrument  counts correct.  The patient was then awaken, taken to recovery  in  stable condition.      Ollen Gross. Vernell Morgans, M.D.  Electronically Signed     PST/MEDQ  D:  06/20/2008  T:  06/20/2008  Job:  [id number]

## 2010-11-08 ENCOUNTER — Encounter: Payer: Self-pay | Admitting: Family Medicine

## 2010-11-08 ENCOUNTER — Ambulatory Visit (INDEPENDENT_AMBULATORY_CARE_PROVIDER_SITE_OTHER): Payer: BC Managed Care – PPO | Admitting: Family Medicine

## 2010-11-08 ENCOUNTER — Telehealth: Payer: Self-pay | Admitting: Family Medicine

## 2010-11-08 VITALS — BP 108/57 | HR 83 | Wt 187.0 lb

## 2010-11-08 DIAGNOSIS — I10 Essential (primary) hypertension: Secondary | ICD-10-CM

## 2010-11-08 DIAGNOSIS — E119 Type 2 diabetes mellitus without complications: Secondary | ICD-10-CM

## 2010-11-08 LAB — POCT UA - MICROALBUMIN: Creatinine, POC: 200 mg/dL

## 2010-11-08 MED ORDER — HYDROCHLOROTHIAZIDE 25 MG PO TABS: 12.5000 mg | ORAL_TABLET | Freq: Every day | ORAL | Status: DC

## 2010-11-08 NOTE — Patient Instructions (Signed)
Keep up the good work.    Cut HCTZ to 1/2 tab once daily.  Urine microalbumin today.  Return for f/u sugars/ A1C / BMP in 2 mos.

## 2010-11-08 NOTE — Telephone Encounter (Signed)
Pls let pt know that her urine microalbumin came back normal.

## 2010-11-08 NOTE — Assessment & Plan Note (Signed)
Doing well on metformin 500 mg bid.  Urine microalbumin normal today.  Held off on adding ACEi.  Repeat BMP at next visit in 2 mos.  Eye exam UTD.  Will do monofilament next visit.  Meter given today.  Start checking AM fastings.

## 2010-11-08 NOTE — Assessment & Plan Note (Signed)
BP has come down with healthy dietary changes.  Will cut her HCTZ in 1/2 to 12.5 mg once daily.  F/u in 2 mos.  Labs UTD.

## 2010-11-08 NOTE — Progress Notes (Signed)
  Subjective:    Patient ID: Peterson Ao, female    DOB: Feb 27, 1946, 65 y.o.   MRN: [id number]  HPI 65 yo AAF presents for f/u T2DM from new dx 2 mos ago.  She has changed her diet.  She just picked up a meter today.  She plans to to start walking soon.  She feels well on metformin 500 mg bid with breakfast and dinner.  Tolerating it well.  Saw the nutritionist.  Her eye exam is UTD.  She is due for urine microalbumin today.  BP 108/57  Pulse 83  Wt 187 lb (84.823 kg)  SpO2 96%    Review of Systems  Constitutional: Negative for fatigue and unexpected weight change.  Eyes: Negative for visual disturbance.  Gastrointestinal: Negative for nausea and diarrhea.  Genitourinary: Negative for frequency.  Neurological: Negative for numbness.       Objective:   Physical Exam  Constitutional: She appears well-developed and well-nourished. No distress.       obese  Eyes: Pupils are equal, round, and reactive to light.  Neck: Neck supple.  Cardiovascular: Normal rate, regular rhythm and normal heart sounds.   Pulmonary/Chest: Effort normal and breath sounds normal.  Musculoskeletal: She exhibits no edema.  Skin: Skin is warm and dry.  Psychiatric: She has a normal mood and affect.          Assessment & Plan:

## 2010-11-11 NOTE — Telephone Encounter (Signed)
Pt notified microalbumin normal. Jarvis Newcomer, LPN [facility]

## 2010-12-03 ENCOUNTER — Encounter: Payer: Self-pay | Admitting: Family Medicine

## 2011-01-10 ENCOUNTER — Ambulatory Visit: Payer: BC Managed Care – PPO | Admitting: Family Medicine

## 2011-03-16 ENCOUNTER — Other Ambulatory Visit: Payer: Self-pay | Admitting: Family Medicine

## 2011-03-28 LAB — COMPREHENSIVE METABOLIC PANEL
ALT: 30 U/L (ref 0–35)
AST: 21 U/L (ref 0–37)
Albumin: 4.1 g/dL (ref 3.5–5.2)
Calcium: 9.5 mg/dL (ref 8.4–10.5)
GFR calc Af Amer: >60 mL/min (ref >60)
Sodium: 137 mEq/L (ref 135–145)
Total Protein: 7.3 g/dL (ref 6.0–8.3)

## 2011-03-28 LAB — CBC
MCHC: 33.1 g/dL (ref 30.0–36.0)
MCHC: 33.8 g/dL (ref 30.0–36.0)
MCV: 93.2 fL (ref 78.0–100.0)
RBC: 4.37 MIL/uL (ref 3.87–5.11)
RDW: 13.8 % (ref 11.5–15.5)

## 2011-03-28 LAB — DIFFERENTIAL
Eosinophils Absolute: 0 10*3/uL (ref 0.0–0.7)
Eosinophils Relative: 1 % (ref 0–5)
Lymphs Abs: 2.8 10*3/uL (ref 0.7–4.0)
Monocytes Relative: 6 % (ref 3–12)

## 2011-05-17 ENCOUNTER — Other Ambulatory Visit: Payer: Self-pay | Admitting: Family Medicine

## 2011-05-19 ENCOUNTER — Encounter (INDEPENDENT_AMBULATORY_CARE_PROVIDER_SITE_OTHER): Payer: Self-pay | Admitting: General Surgery

## 2011-05-19 ENCOUNTER — Ambulatory Visit (INDEPENDENT_AMBULATORY_CARE_PROVIDER_SITE_OTHER): Payer: BC Managed Care – PPO | Admitting: General Surgery

## 2011-05-19 VITALS — BP 140/80 | HR 80 | Temp 98.6°F | Resp 12 | Ht 63.0 in | Wt 184.0 lb

## 2011-05-19 DIAGNOSIS — C50919 Malignant neoplasm of unspecified site of unspecified female breast: Secondary | ICD-10-CM

## 2011-05-19 NOTE — Progress Notes (Signed)
Subjective:     Patient ID: Lori Hess, female   DOB: 1946/03/22, 65 y.o.   MRN: [id number]  HPI The patient is a 65 year old black female who is now 3 years out from a right lumpectomy and axillary node dissection for a T1 C. N1 right breast cancer. She was ER PR positive and HER-2/neu negative. She finished radiation and is now taking anastrozole. She is tolerating this well. She has no complaints. She did have a short episode of nipple sensitivity a couple weeks ago but this has resolved  Review of Systems  Constitutional: Negative.   HENT: Negative.   Eyes: Negative.   Respiratory: Negative.   Cardiovascular: Negative.   Gastrointestinal: Negative.   Genitourinary: Negative.   Musculoskeletal: Negative.   Skin: Negative.   Neurological: Negative.   Hematological: Negative.   Psychiatric/Behavioral: Negative.        Objective:   Physical Exam  Constitutional: She is oriented to person, place, and time. She appears well-developed and well-nourished.  HENT:  Head: Normocephalic and atraumatic.  Eyes: Conjunctivae and EOM are normal. Pupils are equal, round, and reactive to light.  Neck: Normal range of motion. Neck supple.  Cardiovascular: Normal rate, regular rhythm and normal heart sounds.   Pulmonary/Chest: Effort normal and breath sounds normal.       The firmness of the right breast from the radiation has slightly improved. No palpable mass in either breast. No axillary supraclavicular or cervical lymphadenopathy.  Abdominal: Soft. Bowel sounds are normal. She exhibits no mass. There is no tenderness.  Musculoskeletal: Normal range of motion.  Neurological: She is alert and oriented to person, place, and time.  Skin: Skin is warm and dry.  Psychiatric: She has a normal mood and affect. Her behavior is normal.       Assessment:     3 years out from a right lumpectomy and axillary node dissection    Plan:     At this point she will continue her anastrazole. She  will also continue regular self exams. We will see her back in about 6 months

## 2011-05-19 NOTE — Patient Instructions (Signed)
Continue regular self exams Continue anastrazole 

## 2011-06-13 ENCOUNTER — Telehealth: Payer: Self-pay | Admitting: Family Medicine

## 2011-06-13 NOTE — Telephone Encounter (Signed)
Please call patient. Normal mammogram.  Repeat in 1 year.  

## 2011-06-13 NOTE — Telephone Encounter (Signed)
LMOM informing Pt  

## 2011-07-01 ENCOUNTER — Encounter (INDEPENDENT_AMBULATORY_CARE_PROVIDER_SITE_OTHER): Payer: Self-pay | Admitting: Hematology and Oncology

## 2011-08-27 ENCOUNTER — Encounter: Payer: Self-pay | Admitting: *Deleted

## 2011-09-01 ENCOUNTER — Ambulatory Visit (INDEPENDENT_AMBULATORY_CARE_PROVIDER_SITE_OTHER): Payer: BC Managed Care – PPO | Admitting: Physician Assistant

## 2011-09-01 ENCOUNTER — Encounter: Payer: Self-pay | Admitting: Physician Assistant

## 2011-09-01 VITALS — BP 143/73 | HR 86 | Temp 99.5°F | Ht 62.0 in | Wt 183.0 lb

## 2011-09-01 DIAGNOSIS — J40 Bronchitis, not specified as acute or chronic: Secondary | ICD-10-CM

## 2011-09-01 DIAGNOSIS — J329 Chronic sinusitis, unspecified: Secondary | ICD-10-CM

## 2011-09-01 DIAGNOSIS — I10 Essential (primary) hypertension: Secondary | ICD-10-CM

## 2011-09-01 MED ORDER — METHYLPREDNISOLONE (PAK) 4 MG PO TABS: ORAL_TABLET | ORAL | Status: AC

## 2011-09-01 MED ORDER — AMOXICILLIN-POT CLAVULANATE 875-125 MG PO TABS: 1.0000 | ORAL_TABLET | Freq: Two times a day (BID) | ORAL | Status: AC

## 2011-09-01 NOTE — Progress Notes (Signed)
  Subjective:    Patient ID: Lori Hess, female    DOB: [birth year], 66 y.o.   MRN: [id number]  Sinusitis This is a new problem. The current episode started 1 to 4 weeks ago. The problem has been gradually worsening since onset. There has been no fever. Her pain is at a severity of 7/10. The pain is mild. Associated symptoms include congestion, coughing, ear pain, headaches, shortness of breath, sinus pressure, sneezing and swollen glands. Pertinent negatives include no chills or sore throat. (Cough with production.) Past treatments include oral decongestants (sinex.). The treatment provided no relief.  Cough This is a new problem. The current episode started in the past 7 days. The problem has been gradually worsening. The problem occurs every few minutes. The cough is productive of brown sputum. Associated symptoms include ear pain, headaches and shortness of breath. Pertinent negatives include no chest pain, chills or sore throat. The symptoms are aggravated by lying down and cold air. Risk factors for lung disease include smoking/tobacco exposure. She has tried OTC cough suppressant for the symptoms. The treatment provided mild relief. Her past medical history is significant for bronchitis and pneumonia. There is no history of asthma.   Patients blood pressure was elevated today. Patient has been taking OTC decongestants but she has noticed an increase ever since she went completely off the HCTZ tablets a few months ago. She was at 1/2 tab of the 25's and doing great.    Review of Systems  Constitutional: Negative for chills.  HENT: Positive for ear pain, congestion, sneezing and sinus pressure. Negative for sore throat.   Respiratory: Positive for cough and shortness of breath.   Cardiovascular: Negative for chest pain.  Neurological: Positive for headaches.       Objective:   Physical Exam  Constitutional: She is oriented to person, place, and time. She appears well-developed and  well-nourished.  HENT:  Head: Normocephalic and atraumatic.  Right Ear: External ear normal.  Left Ear: External ear normal.  Mouth/Throat: No oropharyngeal exudate.       After cerumen was removed Bilateral TM's normal. Bilateral nares edematous with erythema. Oropharynx erythematous without exudate.  Eyes: Conjunctivae are normal.  Neck: Normal range of motion. Neck supple.       Anterior cervical nodes enlarged and tender to palpation.  Cardiovascular: Normal rate, regular rhythm and normal heart sounds.   Pulmonary/Chest: Effort normal. She has wheezes.       Wheezing with inspiration in the upper lobes of both lung fields.   Lymphadenopathy:    She has cervical adenopathy.  Neurological: She is alert and oriented to person, place, and time.  Psychiatric: She has a normal mood and affect. Her behavior is normal.          Assessment & Plan:  Sinusitis/Bronchitis- Augmentin twice a day for 10 days. Medrol dose pak for bronchitis..After feeling better needs to come in a get a spironmetry referral to evaluate lung function after years of smoking. Encouraged patient to stop smoking but patient is not ready to quit at this time. Call office if not improving in next 3-4 days or if SOB gets worse.  Hypertension- Start HCTZ 1/2 tab of 25mg  back for blood pressure. Recheck in 1 month.   Discussed need for vaccines and patient was made aware.

## 2011-09-01 NOTE — Patient Instructions (Addendum)
Augmentin twice a day for 10 days. Medrol dose pak follow instructions on package. Call if not improving in 3-4 days or if shortness of breath gets worse. Need to consider pulmonary testing after patient feels better.

## 2011-10-01 ENCOUNTER — Encounter: Payer: Self-pay | Admitting: Family Medicine

## 2011-11-21 ENCOUNTER — Encounter (INDEPENDENT_AMBULATORY_CARE_PROVIDER_SITE_OTHER): Payer: Self-pay | Admitting: General Surgery

## 2011-11-21 ENCOUNTER — Ambulatory Visit (INDEPENDENT_AMBULATORY_CARE_PROVIDER_SITE_OTHER): Payer: BC Managed Care – PPO | Admitting: General Surgery

## 2011-11-21 VITALS — BP 130/70 | HR 64 | Temp 97.9°F | Resp 16 | Ht 63.0 in | Wt 185.8 lb

## 2011-11-21 DIAGNOSIS — C50919 Malignant neoplasm of unspecified site of unspecified female breast: Secondary | ICD-10-CM

## 2011-11-21 NOTE — Patient Instructions (Signed)
Continue regular self exams Continue anastrazole 

## 2011-12-08 ENCOUNTER — Encounter (INDEPENDENT_AMBULATORY_CARE_PROVIDER_SITE_OTHER): Payer: Self-pay | Admitting: General Surgery

## 2011-12-08 NOTE — Progress Notes (Signed)
Subjective:     Patient ID: Lori Hess, female   DOB: 29-Aug-1945, 66 y.o.   MRN: [id number]  HPI The patient is a 66 year old black female who is 3-1/2 years out from a right lumpectomy and axillary lymph node dissection for a T1cN1 right breast cancer. Since her last visit she has had some swelling of the right arm. Her compression sleeves did not seem to fit her anymore. She denies any breast pain. She denies any discharge from her nipple.  Review of Systems  Constitutional: Negative.   HENT: Negative.   Eyes: Negative.   Respiratory: Negative.   Cardiovascular: Negative.   Gastrointestinal: Negative.   Genitourinary: Negative.   Musculoskeletal: Negative.   Skin: Negative.   Neurological: Negative.   Hematological: Negative.   Psychiatric/Behavioral: Negative.        Objective:   Physical Exam  Constitutional: She is oriented to person, place, and time. She appears well-developed and well-nourished.  HENT:  Head: Normocephalic and atraumatic.  Eyes: Conjunctivae and EOM are normal. Pupils are equal, round, and reactive to light.  Neck: Normal range of motion. Neck supple.  Cardiovascular: Normal rate, regular rhythm and normal heart sounds.   Pulmonary/Chest: Effort normal and breath sounds normal.       The patient has a lot of diffuse firmness of the right breast secondary to her radiation. This is actually improved since her last visit. No palpable mass in the left breast. No palpable axillary supraclavicular or cervical lymphadenopathy. She has some mild swelling of the right arm.  Abdominal: Soft. Bowel sounds are normal. She exhibits no mass. There is no tenderness.  Musculoskeletal: Normal range of motion.  Lymphadenopathy:    She has no cervical adenopathy.  Neurological: She is alert and oriented to person, place, and time.  Skin: Skin is warm and dry.  Psychiatric: She has a normal mood and affect. Her behavior is normal.       Assessment:     3-1/2 years  out from a right lumpectomy and axillary lymph node dissection    Plan:     At this point I think she needs to be reset for a compression sleeve for the right arm. She will continue to do regular self exams. We will plan to see her back in 6 months

## 2012-02-24 ENCOUNTER — Encounter: Payer: Self-pay | Admitting: Family Medicine

## 2012-02-24 ENCOUNTER — Ambulatory Visit (INDEPENDENT_AMBULATORY_CARE_PROVIDER_SITE_OTHER): Payer: BC Managed Care – PPO | Admitting: Family Medicine

## 2012-02-24 ENCOUNTER — Telehealth: Payer: Self-pay | Admitting: *Deleted

## 2012-02-24 VITALS — BP 125/66 | HR 84 | Wt 186.0 lb

## 2012-02-24 DIAGNOSIS — R1033 Periumbilical pain: Secondary | ICD-10-CM

## 2012-02-24 DIAGNOSIS — R109 Unspecified abdominal pain: Secondary | ICD-10-CM

## 2012-02-24 LAB — COMPLETE METABOLIC PANEL WITH GFR
ALT: 15 U/L (ref 0–35)
AST: 13 U/L (ref 0–37)
Alkaline Phosphatase: 82 U/L (ref 39–117)
Chloride: 104 mEq/L (ref 96–112)
Creat: 0.86 mg/dL (ref 0.50–1.10)
Total Bilirubin: 0.3 mg/dL (ref 0.3–1.2)

## 2012-02-24 LAB — CBC WITH DIFFERENTIAL/PLATELET
Basophils Absolute: 0 10*3/uL (ref 0.0–0.1)
Basophils Relative: 0 % (ref 0–1)
Eosinophils Absolute: 0 10*3/uL (ref 0.0–0.7)
Eosinophils Relative: 1 % (ref 0–5)
Lymphocytes Relative: 45 % (ref 12–46)
MCV: 89.9 fL (ref 78.0–100.0)
Platelets: 281 10*3/uL (ref 150–400)
RDW: 13.6 % (ref 11.5–15.5)
WBC: 5.4 10*3/uL (ref 4.0–10.5)

## 2012-02-24 LAB — URINALYSIS
Nitrite: NEGATIVE
Protein, ur: 30 mg/dL — AB
Specific Gravity, Urine: 1.024 (ref 1.005–1.030)
Urobilinogen, UA: 1 mg/dL (ref 0.0–1.0)

## 2012-02-24 LAB — GAMMA GT: GGT: 25 U/L (ref 7–51)

## 2012-02-24 LAB — LIPASE: Lipase: 30 U/L (ref 0–75)

## 2012-02-24 NOTE — Progress Notes (Signed)
Subjective:    Patient ID: Lori Hess, female    DOB: 22-Aug-1945, 66 y.o.   MRN: [id number]  HPI Has had abdominal pain near her belly button but on the left.  Says it just feels tender to touch.  Hx of BrCA dx in 2009.  No noausae or vomiting.  No diarrhea or constipation.  Has been taking a vitamin called Biotin. No change in caliber stools. No blood in the stool.  + smoker. Has had GB removed. Hx of diverticulosis. Hx of colonoscopy in March 2013. Eating and drinking does not aggravate or alleviate her symptoms. In fact she has no significant alleviating or aggravating symptoms.  no fevers.    Review of Systems     BP 125/66  Pulse 84  Wt 186 lb (84.369 kg)    No Known Allergies  Past Medical History  Diagnosis Date  . Osteoarthritis   . OSA on CPAP   . COPD (chronic obstructive pulmonary disease)   . Tobacco dependence     tried Chantix  . Metabolic syndrome   . Sebaceous cyst     parietal area  . Vitamin d deficiency 3-08    very low- <7.0  . Cancer 2009    right sided breast ca/ Dr Carolynne Edouard, [facility], [place]    Past Surgical History  Procedure Date  . Eyelid lifting 2007  . Cholecystectomy 1970  . Right breast lumpectomy 2009    Dr Carolynne Edouard  . Left cataract surgery 12/11  . Throat surgery     History   Social History  . Marital Status: Divorced    Spouse Name: N/A    Number of Children: N/A  . Years of Education: N/A   Occupational History  . Not on file.   Social History Main Topics  . Smoking status: Current Everyday Smoker -- 1.0 packs/day for 35 years  . Smokeless tobacco: Not on file  . Alcohol Use: No  . Drug Use:   . Sexually Active:    Other Topics Concern  . Not on file   Social History Narrative  . No narrative on file    Family History  Problem Relation Age of Onset  . Cancer Mother     colon and Endometrial CA  . Hypertension Mother   . Diabetes Father     Outpatient Encounter Prescriptions as of 02/24/2012  Medication Sig  Dispense Refill  . anastrozole (ARIMIDEX) 1 MG tablet Take 1 mg by mouth daily.      Marland Kitchen omeprazole (PRILOSEC) 40 MG capsule Take 40 mg by mouth daily.             Objective:   Physical Exam  Constitutional: She is oriented to person, place, and time. She appears well-developed and well-nourished.  HENT:  Head: Normocephalic and atraumatic.  Cardiovascular: Normal rate, regular rhythm and normal heart sounds.   Pulmonary/Chest: Effort normal and breath sounds normal.  Abdominal: Soft. Bowel sounds are normal. She exhibits no distension and no mass. There is tenderness. There is no rebound and no guarding.       Tender just left of the umbilicus.  I do feel a firmness there when she sits up. No wall defect palpable. Unable to palpate when she lays flat.   Neurological: She is alert and oriented to person, place, and time.  Skin: Skin is warm and dry.  Psychiatric: She has a normal mood and affect. Her behavior is normal.  Assessment & Plan:  Umbilical pain - will check CBC w/ diff, amylase, lipase, and liver enzymes. Will get CT abd/pelvis with contrast to eval for possible mass vs hernia.  We were unable to get a stat creatinine so we will schedule her for the CT for tomorrow. She says emergency room if her pain suddenly gets worse , if she vomits, or if she runs a fever. Her pain is mild and really only bothers her with palpation.

## 2012-02-24 NOTE — Telephone Encounter (Signed)
Spoke with Corrie Dandy at [facility] customer service and she states there is no prior auth needed for CT ABD/PELVIS w/contrast.

## 2012-02-25 ENCOUNTER — Other Ambulatory Visit: Payer: Self-pay | Admitting: Family Medicine

## 2012-02-25 ENCOUNTER — Ambulatory Visit (HOSPITAL_BASED_OUTPATIENT_CLINIC_OR_DEPARTMENT_OTHER)
Admission: RE | Admit: 2012-02-25 | Discharge: 2012-02-25 | Disposition: A | Discharge status: Home / Self Care | Payer: BC Managed Care – PPO | Source: Ambulatory Visit | Attending: Family Medicine | Admitting: Family Medicine

## 2012-02-25 DIAGNOSIS — K5730 Diverticulosis of large intestine without perforation or abscess without bleeding: Secondary | ICD-10-CM | POA: Insufficient documentation

## 2012-02-25 DIAGNOSIS — R1033 Periumbilical pain: Secondary | ICD-10-CM | POA: Insufficient documentation

## 2012-02-25 DIAGNOSIS — K439 Ventral hernia without obstruction or gangrene: Secondary | ICD-10-CM

## 2012-02-25 MED ORDER — IOHEXOL 300 MG/ML  SOLN
100.0000 mL | Freq: Once | INTRAMUSCULAR | Status: AC | PRN
  Administered 2012-02-25: 100 mL via INTRAVENOUS

## 2012-03-18 ENCOUNTER — Encounter (INDEPENDENT_AMBULATORY_CARE_PROVIDER_SITE_OTHER): Payer: Self-pay | Admitting: General Surgery

## 2012-03-18 ENCOUNTER — Ambulatory Visit (INDEPENDENT_AMBULATORY_CARE_PROVIDER_SITE_OTHER): Payer: BC Managed Care – PPO | Admitting: General Surgery

## 2012-03-18 VITALS — BP 130/64 | HR 84 | Temp 97.5°F | Ht 63.0 in | Wt 189.6 lb

## 2012-03-18 DIAGNOSIS — K429 Umbilical hernia without obstruction or gangrene: Secondary | ICD-10-CM

## 2012-03-18 NOTE — Progress Notes (Signed)
Subjective:     Patient ID: Lori Hess, female   DOB: 01-25-1946, 66 y.o.   MRN: [id number]  HPI We're asked to see the patient in consultation by Dr. Linford Arnold to evaluate her for an umbilical hernia. The patient is a 66 year old black female who is well-known to me. She is 3 years and not months status post right lumpectomy and axillary lymph node dissection for a T1 C. In 1 right breast cancer. About 6 weeks ago she started developing some occasional sharp pain just to the left of her umbilicus. She has also noticed some itching in the area. She has some discomfort associated with it about 2 times a week. She denies any nausea or vomiting. Her bowels are moving regularly. She denies any bloating or abdominal distention. As part of her workup she underwent a CT scan which showed a small umbilical hernia with no evidence of obstruction  Review of Systems  Constitutional: Negative.   HENT: Negative.   Eyes: Negative.   Respiratory: Negative.   Cardiovascular: Negative.   Gastrointestinal: Positive for abdominal pain. Negative for nausea, vomiting, constipation and abdominal distention.  Genitourinary: Negative.   Musculoskeletal: Negative.   Skin: Negative.   Neurological: Negative.   Hematological: Negative.   Psychiatric/Behavioral: Negative.        Objective:   Physical Exam  Constitutional: She is oriented to person, place, and time. She appears well-developed and well-nourished.  HENT:  Head: Normocephalic and atraumatic.  Eyes: Conjunctivae normal and EOM are normal. Pupils are equal, round, and reactive to light.  Neck: Normal range of motion. Neck supple.  Cardiovascular: Normal rate, regular rhythm and normal heart sounds.   Pulmonary/Chest: Effort normal and breath sounds normal.  Abdominal: Soft. Bowel sounds are normal. She exhibits no distension and no mass. There is no tenderness.       She has some upper abdominal rectus diastases. I cannot palpate a definite  fascial defect near the umbilicus. She is nontender today.  Musculoskeletal: Normal range of motion.  Neurological: She is alert and oriented to person, place, and time.  Skin: Skin is warm and dry.  Psychiatric: She has a normal mood and affect. Her behavior is normal.       Assessment:     The patient has a small nonobstructing umbilical hernia by CT scan associated with some intermittent discomfort. I have talked her today about the potential for the hernia to become incarcerated or strangulated.    Plan:     At this point she does not want to have surgery. Since she is warm on long-term breast cancer patients I will be seeing her back on a regular basis. We will plan to recheck her in about 3 months. She will call me immediately if she has any changes or problems.

## 2012-03-18 NOTE — Patient Instructions (Signed)
Call for any abdominal pain, fever, nausea

## 2012-06-28 ENCOUNTER — Ambulatory Visit (INDEPENDENT_AMBULATORY_CARE_PROVIDER_SITE_OTHER): Payer: BC Managed Care – PPO | Admitting: General Surgery

## 2012-07-22 ENCOUNTER — Ambulatory Visit (INDEPENDENT_AMBULATORY_CARE_PROVIDER_SITE_OTHER): Payer: BC Managed Care – PPO | Admitting: General Surgery

## 2012-08-04 ENCOUNTER — Ambulatory Visit (INDEPENDENT_AMBULATORY_CARE_PROVIDER_SITE_OTHER): Payer: BC Managed Care – PPO | Admitting: General Surgery

## 2013-06-03 LAB — HM MAMMOGRAPHY

## 2013-06-29 ENCOUNTER — Encounter (INDEPENDENT_AMBULATORY_CARE_PROVIDER_SITE_OTHER): Payer: Self-pay

## 2013-07-08 ENCOUNTER — Encounter: Payer: BC Managed Care – PPO | Admitting: Family Medicine

## 2013-08-08 ENCOUNTER — Ambulatory Visit: Payer: BC Managed Care – PPO | Admitting: Physician Assistant

## 2013-08-12 ENCOUNTER — Ambulatory Visit (INDEPENDENT_AMBULATORY_CARE_PROVIDER_SITE_OTHER): Payer: BC Managed Care – PPO | Admitting: Physician Assistant

## 2013-08-12 ENCOUNTER — Ambulatory Visit (INDEPENDENT_AMBULATORY_CARE_PROVIDER_SITE_OTHER): Payer: BC Managed Care – PPO

## 2013-08-12 ENCOUNTER — Encounter: Payer: Self-pay | Admitting: Physician Assistant

## 2013-08-12 VITALS — BP 137/64 | HR 97 | Temp 98.2°F | Ht 63.0 in | Wt 175.0 lb

## 2013-08-12 DIAGNOSIS — R918 Other nonspecific abnormal finding of lung field: Secondary | ICD-10-CM

## 2013-08-12 DIAGNOSIS — R0602 Shortness of breath: Secondary | ICD-10-CM

## 2013-08-12 DIAGNOSIS — I82402 Acute embolism and thrombosis of unspecified deep veins of left lower extremity: Secondary | ICD-10-CM | POA: Insufficient documentation

## 2013-08-12 DIAGNOSIS — I82409 Acute embolism and thrombosis of unspecified deep veins of unspecified lower extremity: Secondary | ICD-10-CM

## 2013-08-12 DIAGNOSIS — R222 Localized swelling, mass and lump, trunk: Secondary | ICD-10-CM

## 2013-08-12 DIAGNOSIS — J189 Pneumonia, unspecified organism: Secondary | ICD-10-CM | POA: Insufficient documentation

## 2013-08-12 DIAGNOSIS — E119 Type 2 diabetes mellitus without complications: Secondary | ICD-10-CM

## 2013-08-12 MED ORDER — RIVAROXABAN 20 MG PO TABS: 20.0000 mg | ORAL_TABLET | Freq: Every day | ORAL | Status: DC

## 2013-08-12 MED ORDER — SAXAGLIPTIN-METFORMIN ER 5-1000 MG PO TB24: ORAL_TABLET | ORAL | Status: DC

## 2013-08-12 MED ORDER — AMBULATORY NON FORMULARY MEDICATION: Status: AC

## 2013-08-12 NOTE — Progress Notes (Signed)
   Subjective:    Patient ID: Lori Hess, female    DOB: Aug 08, 1945, 68 y.o.   MRN: [id number]  HPI Pt is a 68 yo woman who presents to the clinic as follow up from ER on 08/03/13 for DVT of left leg and pneumonia. Pt is was put in a boot a couple of weeks ago and went to UC with swelling and fever. U/S showed DVT. She was placed on Xarelto. She is doing well today. NO swelling of left foot or pain.   She really didn't have any symptoms of pneumonia but was found on CXR. She had some SOB but pt has COPD so did not think anything of it. Treated with levaquin. Pt would like a note for work so she can park closer.   Glucose was elevated in ER. Sent to PCP to follow up on. She has been told before she had diabetes but has not wanted to accept or do anything to make better. Not on any diet and does not exercise.     Review of Systems     Objective:   Physical Exam  Constitutional: She is oriented to person, place, and time. She appears well-developed and well-nourished.  HENT:  Head: Normocephalic and atraumatic.  Cardiovascular: Normal rate, regular rhythm and normal heart sounds.   Pulmonary/Chest: Effort normal and breath sounds normal. She has no wheezes.  Pulse ox 97 percent.   Neurological: She is alert and oriented to person, place, and time.  Skin: Skin is dry.  Left leg- no swelling, tenderness, redness or bruising.   Psychiatric: She has a normal mood and affect. Her behavior is normal.          Assessment & Plan:  DVT of left leg- continue on xarelto twice a day until gets to 21 days then start 20mg  daily. Gave one bottle of samples and then rx to stay on for 3 months. Gave rx for compression stocking to wear as much as possible. Encouraged daily walking for at least 66min a day.   Pneumonia-will check resolution with CXR. Pulse ox and no symptoms is reassuring. Pt has finished her abx dose. Pt continues to smoke.  Tobacco abuse- discussed importance of cessation. Pt  not ready to quit at this point. I feel like with recent SOB and smoking hx pt should come back in one month for spirometry to evaluate lung function.   Diabetes uncontrolled- A1C was 10.1. Discussed with pt it is time to take ownership over DM. She has been in denial. Will start with [place] daily. Gave samples and rx to fill. Discussed GI SE. Pt has glucometer at home but unsure how to use. Discussed bring in to office or take to pharmacy to teach how to use. Need to start checking sugars in morning before breakfast. Will recheck A1C in 3 months. Talked about low carb low sugar diet. What you put in your mouth is directly effecting your sugars.

## 2013-08-12 NOTE — Patient Instructions (Addendum)
Follow up in 2 weeks with spirometry.   I want you to start checking fasting sugars. Bring in your glucometer so that nurse can help you figure it out.   Continue on Xaralto twice a day for 21 days from ER visit, then start xaralto 20mg  daily.    Start kombiglyze daily. Gave samples. Follow up in 3 months.

## 2013-08-15 ENCOUNTER — Ambulatory Visit (INDEPENDENT_AMBULATORY_CARE_PROVIDER_SITE_OTHER): Payer: BC Managed Care – PPO | Admitting: Sports Medicine

## 2013-08-15 ENCOUNTER — Telehealth: Payer: Self-pay | Admitting: *Deleted

## 2013-08-15 ENCOUNTER — Other Ambulatory Visit: Payer: Self-pay | Admitting: Physician Assistant

## 2013-08-15 ENCOUNTER — Ambulatory Visit (INDEPENDENT_AMBULATORY_CARE_PROVIDER_SITE_OTHER): Payer: BC Managed Care – PPO

## 2013-08-15 DIAGNOSIS — I878 Other specified disorders of veins: Secondary | ICD-10-CM | POA: Insufficient documentation

## 2013-08-15 DIAGNOSIS — R918 Other nonspecific abnormal finding of lung field: Secondary | ICD-10-CM

## 2013-08-15 DIAGNOSIS — I998 Other disorder of circulatory system: Secondary | ICD-10-CM

## 2013-08-15 DIAGNOSIS — R222 Localized swelling, mass and lump, trunk: Secondary | ICD-10-CM

## 2013-08-15 MED ORDER — IOHEXOL 300 MG/ML  SOLN
75.0000 mL | Freq: Once | INTRAMUSCULAR | Status: AC | PRN
  Administered 2013-08-15: 75 mL via INTRAVENOUS

## 2013-08-15 NOTE — Assessment & Plan Note (Signed)
20-gauge Angiocath placed in the left cephalic vein. Patient sent downstairs for CT with contrast.

## 2013-08-15 NOTE — Progress Notes (Signed)
  Subjective:    CC: Needs venous access  HPI: This is a pleasant 68 year old female, she is currently being evaluated for thoracic mass, she was supposed to get a chest CT with IV contrast however they were unable to establish on IV and I was called for further evaluation and definitive treatment.  Past medical history, Surgical history, Family history not pertinant except as noted below, Social history, Allergies, and medications have been entered into the medical record, reviewed, and no changes needed.   Review of Systems: No fevers, chills, night sweats, weight loss, chest pain, or shortness of breath.   Objective:    General: Well Developed, well nourished, and in no acute distress.  Neuro: Alert and oriented x3, extra-ocular muscles intact, sensation grossly intact.  HEENT: Normocephalic, atraumatic, pupils equal round reactive to light, neck supple, no masses, no lymphadenopathy, thyroid nonpalpable.  Skin: Warm and dry, no rashes. Cardiac: Regular rate and rhythm, no murmurs rubs or gallops, no lower extremity edema.  Respiratory: Clear to auscultation bilaterally. Not using accessory muscles, speaking in full sentences.   I was able to place a 20-gauge angiocatheter in the left cephalic vein.   Impression and Recommendations:   I spent 25 minutes with this patient for greater than 50% was face to face time counseling regarding establishing venous access.

## 2013-08-15 NOTE — Telephone Encounter (Signed)
No PA required for Ct Chest w/contrast.  Oscar La, LPN

## 2013-08-16 ENCOUNTER — Encounter: Payer: Self-pay | Admitting: Family Medicine

## 2013-08-22 ENCOUNTER — Telehealth: Payer: Self-pay | Admitting: *Deleted

## 2013-08-22 ENCOUNTER — Other Ambulatory Visit: Payer: Self-pay | Admitting: Medical Oncology

## 2013-08-22 DIAGNOSIS — R918 Other nonspecific abnormal finding of lung field: Secondary | ICD-10-CM

## 2013-08-22 NOTE — Telephone Encounter (Signed)
Called pt gave appt for 08/23/13 at 11:00 labs and 11:30 to see Dr. Julien Nordmann.  She verbalized understanding of appt.

## 2013-08-23 ENCOUNTER — Other Ambulatory Visit: Payer: Self-pay | Admitting: Internal Medicine

## 2013-08-23 ENCOUNTER — Other Ambulatory Visit: Payer: BC Managed Care – PPO

## 2013-08-23 ENCOUNTER — Ambulatory Visit: Payer: BC Managed Care – PPO | Admitting: Internal Medicine

## 2013-08-23 ENCOUNTER — Ambulatory Visit: Payer: BC Managed Care – PPO

## 2013-08-23 ENCOUNTER — Telehealth: Payer: Self-pay | Admitting: *Deleted

## 2013-08-23 NOTE — Telephone Encounter (Signed)
Called pt to set up appt.  She stated she wants to be seen at [facility].  She stated she already has an appt there.  I will notify Dr. Julien Nordmann and HIM dept.

## 2013-08-26 ENCOUNTER — Other Ambulatory Visit: Payer: BC Managed Care – PPO | Admitting: Physician Assistant

## 2013-09-16 DIAGNOSIS — C3490 Malignant neoplasm of unspecified part of unspecified bronchus or lung: Secondary | ICD-10-CM | POA: Insufficient documentation

## 2013-10-29 ENCOUNTER — Other Ambulatory Visit: Payer: Self-pay | Admitting: Physician Assistant

## 2013-11-11 DIAGNOSIS — C3490 Malignant neoplasm of unspecified part of unspecified bronchus or lung: Secondary | ICD-10-CM | POA: Insufficient documentation

## 2013-11-29 ENCOUNTER — Other Ambulatory Visit: Payer: Self-pay | Admitting: Physician Assistant

## 2013-12-03 ENCOUNTER — Other Ambulatory Visit: Payer: Self-pay | Admitting: Family Medicine

## 2013-12-26 ENCOUNTER — Other Ambulatory Visit: Payer: Self-pay | Admitting: Family Medicine

## 2013-12-27 NOTE — Telephone Encounter (Signed)
Must schedule a f/u appt before future refills

## 2014-01-02 ENCOUNTER — Other Ambulatory Visit: Payer: Self-pay | Admitting: Family Medicine

## 2014-01-20 ENCOUNTER — Telehealth: Payer: Self-pay | Admitting: Family Medicine

## 2014-01-20 ENCOUNTER — Encounter: Payer: Self-pay | Admitting: Family Medicine

## 2014-01-20 ENCOUNTER — Ambulatory Visit (INDEPENDENT_AMBULATORY_CARE_PROVIDER_SITE_OTHER): Payer: BC Managed Care – PPO | Admitting: Family Medicine

## 2014-01-20 VITALS — BP 133/70 | HR 118 | Ht 63.0 in | Wt 170.1 lb

## 2014-01-20 DIAGNOSIS — D6481 Anemia due to antineoplastic chemotherapy: Secondary | ICD-10-CM

## 2014-01-20 DIAGNOSIS — E119 Type 2 diabetes mellitus without complications: Secondary | ICD-10-CM | POA: Diagnosis not present

## 2014-01-20 DIAGNOSIS — K921 Melena: Secondary | ICD-10-CM

## 2014-01-20 DIAGNOSIS — T451X5A Adverse effect of antineoplastic and immunosuppressive drugs, initial encounter: Secondary | ICD-10-CM

## 2014-01-20 DIAGNOSIS — I10 Essential (primary) hypertension: Secondary | ICD-10-CM | POA: Diagnosis not present

## 2014-01-20 DIAGNOSIS — J449 Chronic obstructive pulmonary disease, unspecified: Secondary | ICD-10-CM

## 2014-01-20 LAB — COMPLETE METABOLIC PANEL WITH GFR
ALT: 12 U/L (ref 0–35)
AST: 12 U/L (ref 0–37)
Albumin: 3.6 g/dL (ref 3.5–5.2)
Alkaline Phosphatase: 55 U/L (ref 39–117)
BUN: 22 mg/dL (ref 6–23)
CALCIUM: 8.5 mg/dL (ref 8.4–10.5)
CHLORIDE: 102 meq/L (ref 96–112)
CO2: 26 meq/L (ref 19–32)
CREATININE: 0.76 mg/dL (ref 0.50–1.10)
GFR, Est Non African American: 80 mL/min
GLUCOSE: 195 mg/dL — AB (ref 70–99)
Potassium: 4.2 mEq/L (ref 3.5–5.3)
Sodium: 138 mEq/L (ref 135–145)
Total Bilirubin: 0.2 mg/dL (ref 0.2–1.2)
Total Protein: 6.1 g/dL (ref 6.0–8.3)

## 2014-01-20 LAB — CBC
HEMATOCRIT: 18.5 % — AB (ref 36.0–46.0)
HEMOGLOBIN: 6.3 g/dL — AB (ref 12.0–15.0)
MCH: 32.5 pg (ref 26.0–34.0)
MCHC: 34.1 g/dL (ref 30.0–36.0)
MCV: 95.4 fL (ref 78.0–100.0)
PLATELETS: 79 10*3/uL — AB (ref 150–400)
RBC: 1.94 MIL/uL — AB (ref 3.87–5.11)
RDW: 18.1 % — ABNORMAL HIGH (ref 11.5–15.5)
WBC: 3 10*3/uL — AB (ref 4.0–10.5)

## 2014-01-20 LAB — HEMOGLOBIN A1C
Hgb A1c MFr Bld: 7.4 % — ABNORMAL HIGH (ref <5.7)
Mean Plasma Glucose: 166 mg/dL — ABNORMAL HIGH (ref <117)

## 2014-01-20 NOTE — Progress Notes (Signed)
Subjective:    Patient ID: Lori Hess, female    DOB: 1945/09/09, 68 y.o.   MRN: [id number]  HPI Diabetes - no hypoglycemic events. No wounds or sores that are not healing well. No increased thirst or urination. Checking glucose at home. She is no longer taking her Kombiglyze. She says the co-pay went up and so she did not have it filled.  Hypertension- Pt denies chest pain, SOB, dizziness, or heart palpitations.  Taking meds as directed w/o problems.  Denies medication side effects.    COPD - stable. She's not really had any significant problems with shortness of breath. She's currently being treated for lung cancer at [facility].  She is chemotherapy and has had low blood counts.  She had some BRB in stool last week. She has not seen it again until this morning. She said this point she had 2 bowel movements and says they looked more dark and black-looking. She denies any dizziness or normal pain. She does take Xarelto blood thinner. She has been very constipated.  She has been using colace and sennokto. Says she is probably not drinking enough water. She did feel little lightheaded this morning.  Review of Systems  BP 133/70  Pulse 118  Ht 5\' 3"  (1.6 m)  Wt 170 lb 1.9 oz (77.166 kg)  BMI 30.14 kg/m2    No Known Allergies  Past Medical History  Diagnosis Date  . Osteoarthritis   . OSA on CPAP   . COPD (chronic obstructive pulmonary disease)   . Tobacco dependence     tried Chantix  . Metabolic syndrome   . Sebaceous cyst     parietal area  . Vitamin D deficiency 3-08    very low- <7.0  . Cancer 2009    right sided breast ca/ Dr Marlou Starks, [place], [place]  . Diabetes mellitus without complication     Past Surgical History  Procedure Laterality Date  . Eyelid lifting  2007  . Cholecystectomy  1970  . Right breast lumpectomy  2009    Dr Marlou Starks  . Left cataract surgery  12/11  . Throat surgery    . Right cataract surgery      History   Social History  . Marital  Status: Divorced    Spouse Name: N/A    Number of Children: N/A  . Years of Education: N/A   Occupational History  . Not on file.   Social History Main Topics  . Smoking status: Current Every Day Smoker -- 1.00 packs/day for 35 years  . Smokeless tobacco: Not on file  . Alcohol Use: No  . Drug Use:   . Sexual Activity:    Other Topics Concern  . Not on file   Social History Narrative  . No narrative on file    Family History  Problem Relation Age of Onset  . Cancer Mother     colon and Endometrial CA  . Hypertension Mother   . Diabetes Father     Outpatient Encounter Prescriptions as of 01/20/2014  Medication Sig  . AMBULATORY NON FORMULARY MEDICATION Knee high compression stockings 30-40 mmHG for DVT prevention.  Marland Kitchen anastrozole (ARIMIDEX) 1 MG tablet Take 1 mg by mouth daily.  Marland Kitchen HYDROcodone-acetaminophen (NORCO/VICODIN) 5-325 MG per tablet   . sitaGLIPtin-metformin (JANUMET) 50-1000 MG per tablet Take 1 tablet by mouth 2 (two) times daily with a meal.  . XARELTO 20 MG TABS tablet TAKE 1 TABLET BY MOUTH EVERY DAY WITH SUPPER  . [  DISCONTINUED] KOMBIGLYZE XR 10-998 MG TB24 TAKE 1 TABLET BY MOUTH EVERY DAY          Objective:   Physical Exam  Constitutional: She is oriented to person, place, and time. She appears well-developed and well-nourished.  HENT:  Head: Normocephalic and atraumatic.  Neck: Neck supple. No thyromegaly present.  Cardiovascular: Normal rate, regular rhythm and normal heart sounds.   Pulmonary/Chest: Effort normal and breath sounds normal.  Neurological: She is alert and oriented to person, place, and time.  Skin: Skin is warm and dry.  Psychiatric: She has a normal mood and affect. Her behavior is normal.          Assessment & Plan:  Diabetes- unfortunately we were unable to get hemoglobin A1c read our machine today. He kept saying that her hemoglobin was too low. I did go back into the [facility] system and she had a CBC checked about 2  weeks ago. At that time her hemoglobin was 9.4. We will check a CBC and a hemoglobin A1c with the venipuncture and we'll send her to the lab. The exam was performed today. She really doesn't remember her last eye exam. Encouraged her to schedule this at her convenience.  She says her Onc will make the GI referral.  Lab Results  Component Value Date   HGBA1C 8.3 09/11/2010   Anemia-last hemoglobin was 94 for approximately 2 weeks ago. She's on chemotherapy.  HTN - wel controlled. Continue regimen.    Blood in stool-offered to do exam today but she declined. I do think she should see a GI specialist for further evaluation. Did give her the warning signs and symptoms to go to the emergency department over the weekend if she continues to see blood in the stool or starts to have severe abdominal pain or feel lightheaded or dizzy. We will recheck her CBC today and get that faxed over to her oncologist.

## 2014-01-20 NOTE — Telephone Encounter (Signed)
Notified by "Hinton Dyer" at call a nurse regarding a hemoglobin result of 6.3 in this patient who is on Xarelto who reports dark stools today and unfortunately also has a platelet count of 79.  Left a message just now regarding my advice for her to report to her nearest emergency room immediately for consideration of a blood transfusion and further workup.  Additionally advised to stop Xarelto immediately.   Tried again at 8:15pm to contact patient only to get her voicemail again. Contacted Dana at call a nurse and requested that she try contacting the patient later tonight, joint decision of 9pm, to again relay the advice above.

## 2014-01-24 ENCOUNTER — Other Ambulatory Visit: Payer: Self-pay | Admitting: *Deleted

## 2014-01-24 ENCOUNTER — Telehealth: Payer: Self-pay | Admitting: *Deleted

## 2014-01-24 MED ORDER — OMEPRAZOLE 40 MG PO CPDR: DELAYED_RELEASE_CAPSULE | ORAL | Status: AC

## 2014-01-24 NOTE — Telephone Encounter (Signed)
Called and checked on pt she stated that she was switched to omeprazole 40 mg twice a day and would like a 90 day supply. Also she stated that the janumet is too expensive I told her to come by and p/u a co-pay card and that this would be left up front for her to p/u at her convenience.Lori Hess

## 2014-01-30 ENCOUNTER — Telehealth: Payer: Self-pay | Admitting: Physician Assistant

## 2014-01-30 ENCOUNTER — Encounter: Payer: BC Managed Care – PPO | Admitting: Physician Assistant

## 2014-01-30 DIAGNOSIS — Z0289 Encounter for other administrative examinations: Secondary | ICD-10-CM

## 2014-01-30 NOTE — Progress Notes (Signed)
This encounter was created in error - please disregard.

## 2014-01-30 NOTE — Telephone Encounter (Signed)
Call pt: she missed morning appt for today to follow up after discharge from hospital. Please come in to follow up.

## 2014-01-31 ENCOUNTER — Encounter: Payer: Self-pay | Admitting: Family Medicine

## 2014-01-31 NOTE — Telephone Encounter (Signed)
LMOM to return my call./Mischell Branford,CMA

## 2014-02-25 ENCOUNTER — Other Ambulatory Visit: Payer: Self-pay | Admitting: Physician Assistant

## 2014-08-03 ENCOUNTER — Other Ambulatory Visit: Payer: Self-pay | Admitting: Family Medicine

## 2014-08-04 NOTE — Telephone Encounter (Signed)
Called Patient requesting a office visit before refilling meds.

## 2014-12-18 ENCOUNTER — Other Ambulatory Visit: Payer: Self-pay

## 2014-12-18 ENCOUNTER — Encounter: Payer: Self-pay | Admitting: *Deleted

## 2015-05-30 IMAGING — CT CT CHEST W/ CM
3 series · 17 of 29 positions shown, 18 images · IV contrast (omnipaque)
Comparison: Chest radiograph, [DATE].

CLINICAL DATA: Followup left lower lobe masslike opacity.

EXAM:
CT CHEST WITH CONTRAST
TECHNIQUE: Multidetector CT imaging of the chest was performed during
intravenous contrast administration.
CONTRAST:  75mL OMNIPAQUE IOHEXOL 300 MG/ML  SOLN

[Series 2: chest with · axial · 0.83mm/px · z∈[-209,-119]mm · 3 of 55 slices shown, 4 images]
[im 19/55  mediastinal]
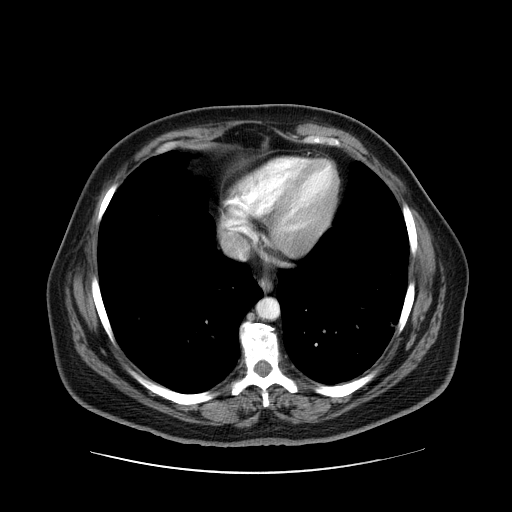
[im 19/55  lung]
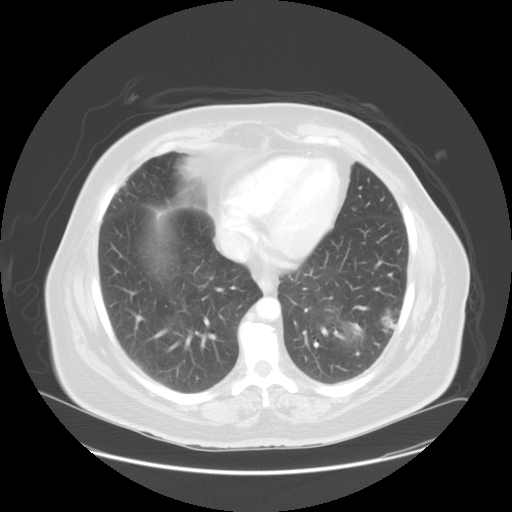
[im 34/55  lung]
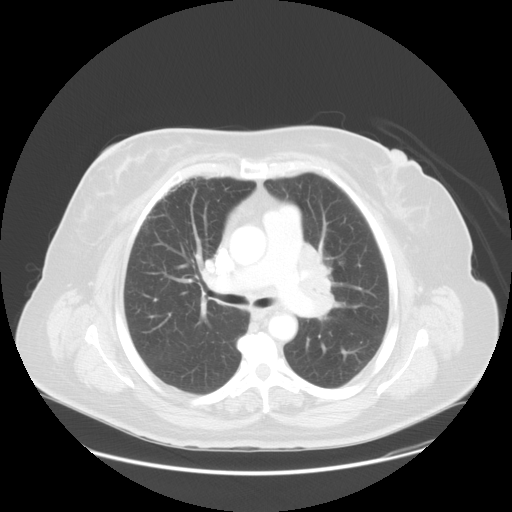
[im 37/55  lung]
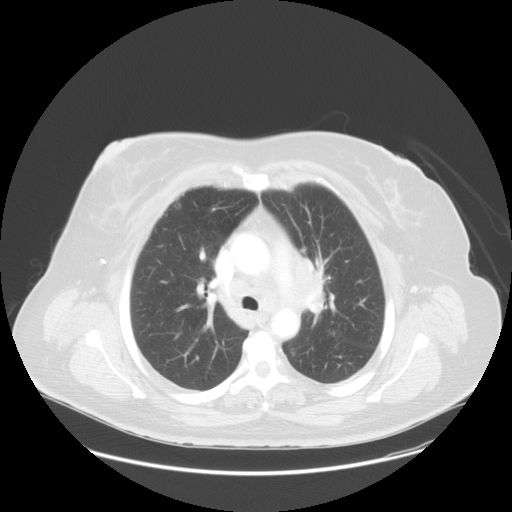

[Series 400: sag · sagittal · 0.83mm/px · 8 of 203 slices shown]
[im 17/203  lung]
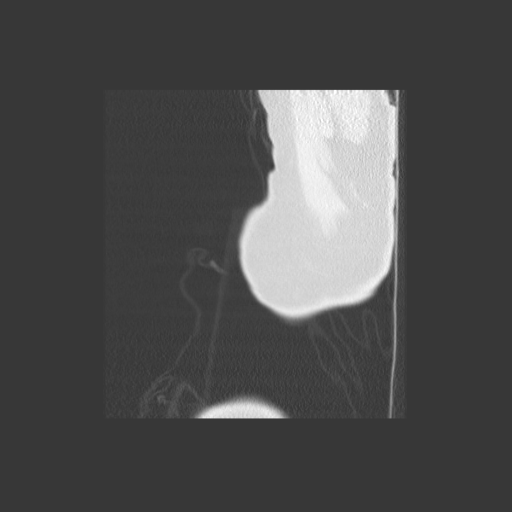
[im 51/203  lung]
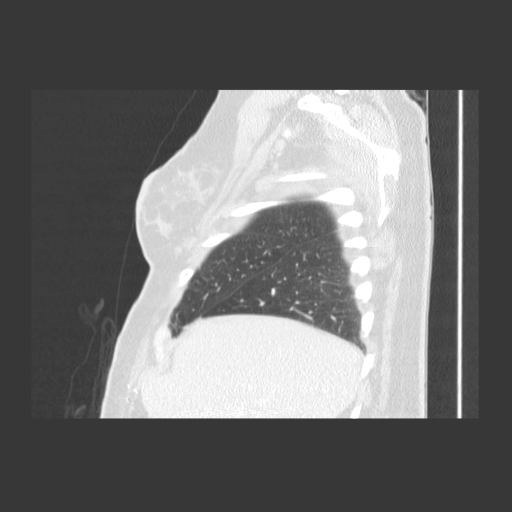
[im 68/203  lung]
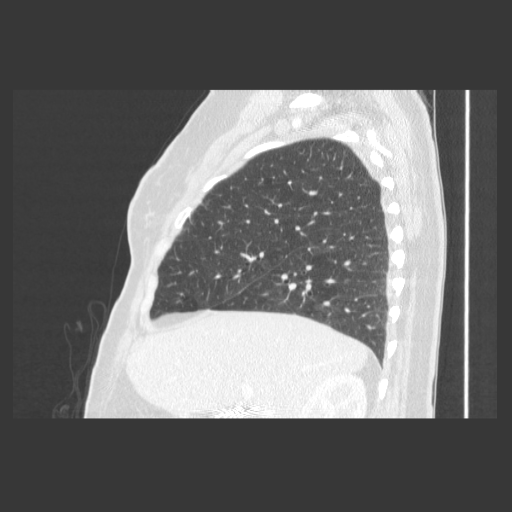
[im 85/203  lung]
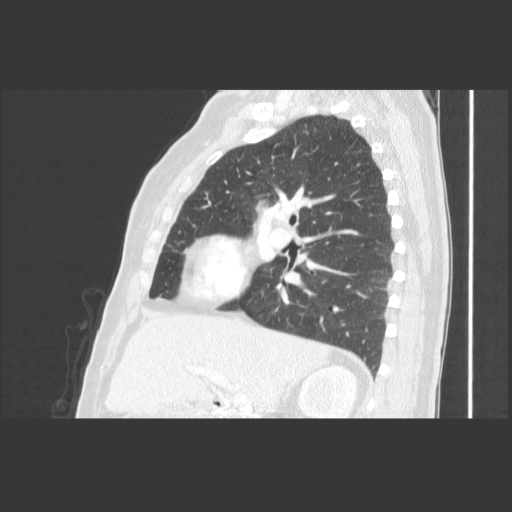
[im 118/203  lung]
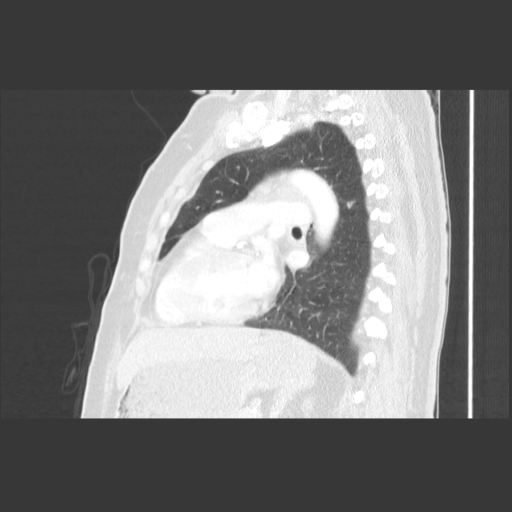
[im 135/203  lung]
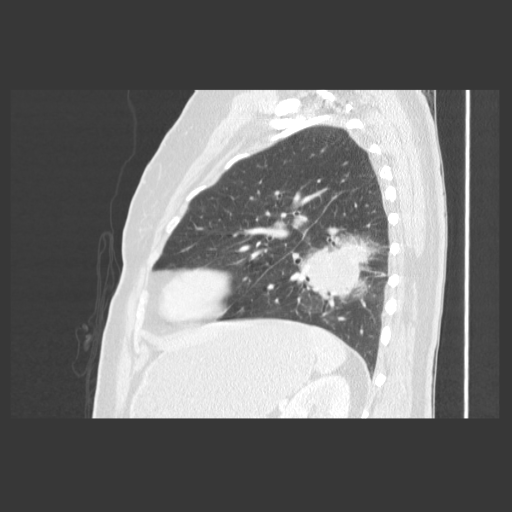
[im 152/203  lung]
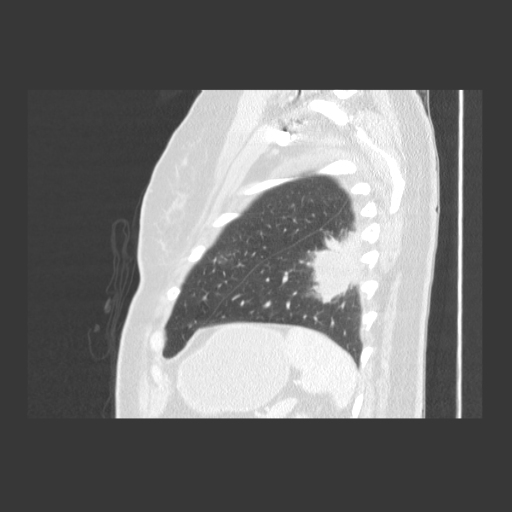
[im 186/203  lung]
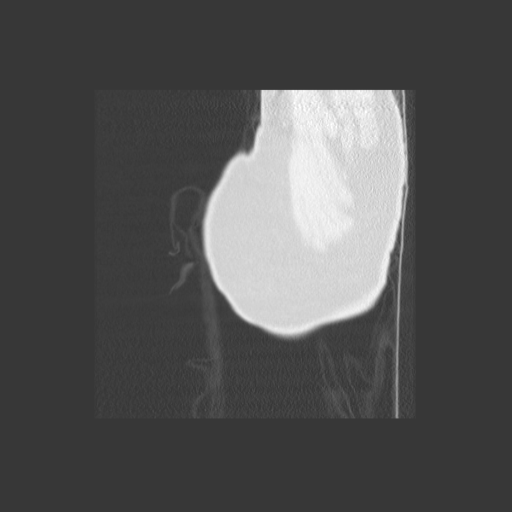

[Series 401: cor · coronal · 0.83mm/px · 6 of 167 slices shown]
[im 17/167  lung]
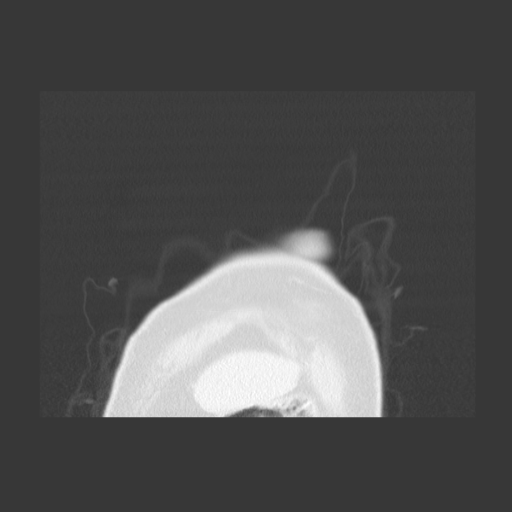
[im 34/167  lung]
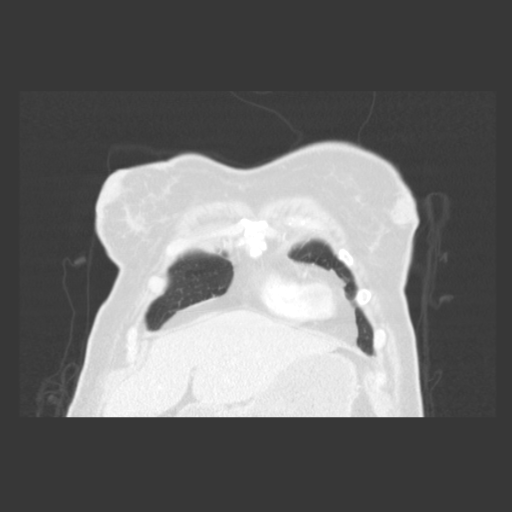
[im 50/167  lung]
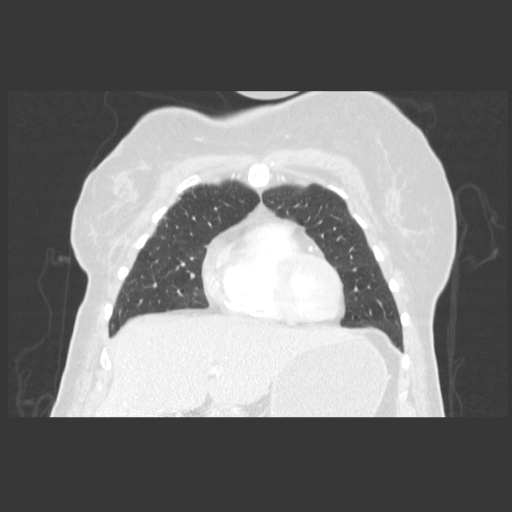
[im 67/167  lung]
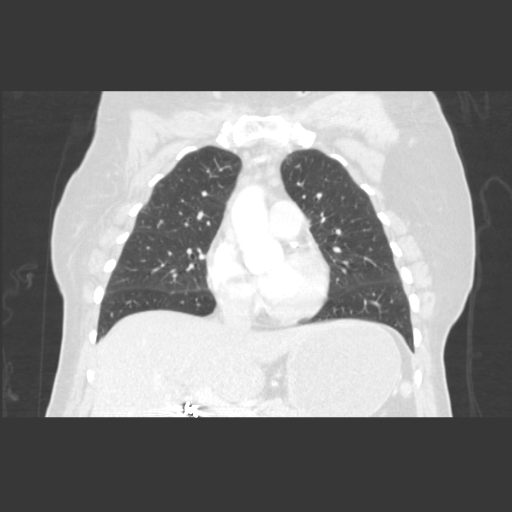
[im 100/167  lung]
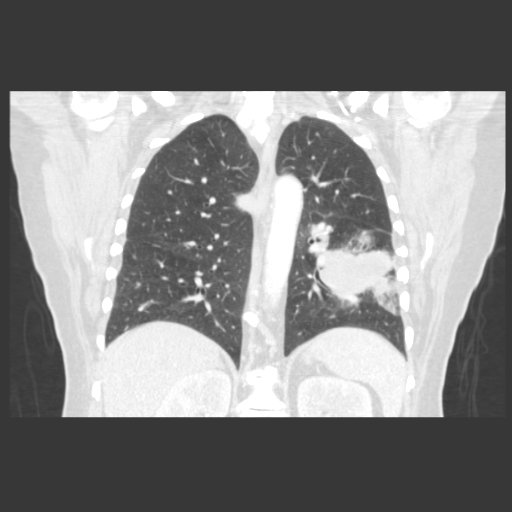
[im 117/167  lung]
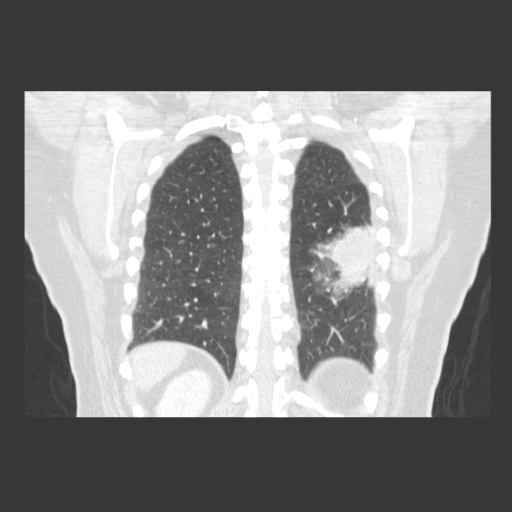

[17 of 29 positions shown; findings below may reference images not displayed]

FINDINGS: There is irregular left lower lobe mass measuring 6.3 cm x 5.2 cm x
6.2 cm. Mass extends from the central lower lobe segmental bronchi
posterior laterally to abut the posterior lateral pleural margin.

There is a lobulated second mass in the left upper lobe lingula that
measures 21 mm x 11 mm in greatest transverse dimensions.

No other lung masses or nodules. There is peripheral interstitial
thickening that is most evident in the right upper lobe. No
pneumonia or edema. No pleural effusion is seen.

There is left hilar as well as mediastinal adenopathy. Left hilar
adenopathy is confluent. A node adjacent to the upper lobe bronchus
anterior to the left lower lobe pulmonary artery measures 16 mm in
short axis. A node anterior to the left superior pulmonary vein
measures 15 mm in short axis. There is anterior mediastinal
adenopathy confluent with the hilar adenopathy wrapping around the
left pulmonary artery. There is a right peritracheal lymph node
adjacent to the azygos vein measuring 14 mm in short axis. This lies
to the right of midline. A subcarinal lymph node, lying just to the
left of midline, measures 16.6 mm in short axis.

No neck base or axillary masses or adenopathy are noted. There are
changes from previous right breast surgery.

No masses or lesions are seen in the imaged portion of the liver. No
adrenal masses are visualized. There are changes from a
cholecystectomy.

No osteoblastic or osteolytic lesions.
IMPRESSION: 1. Large left lower lobe mass measuring 6.3 cm in greatest
dimension. There is a secondary smaller lobulated mass in the left
upper lobe lingula measuring 21 mm in greatest dimension. There is
associated left hilar and mediastinal adenopathy. This combination
of findings is most suggestive of a primary left lower lobe
bronchogenic carcinoma with a metastatic lesion to the left upper
lobe and metastatic hilar and mediastinal adenopathy.

## 2018-12-22 DEATH — deceased
